# Patient Record
Sex: Male | Born: 1950
Health system: Southern US, Community
[De-identification: ages and names within clinical notes are randomized; demographics above are authoritative.]

## PROBLEM LIST (undated history)

## (undated) DIAGNOSIS — E119 Type 2 diabetes mellitus without complications: Secondary | ICD-10-CM

## (undated) DIAGNOSIS — I10 Essential (primary) hypertension: Secondary | ICD-10-CM

## (undated) DIAGNOSIS — Z973 Presence of spectacles and contact lenses: Secondary | ICD-10-CM

## (undated) DIAGNOSIS — N401 Enlarged prostate with lower urinary tract symptoms: Secondary | ICD-10-CM

## (undated) DIAGNOSIS — Z86010 Personal history of colonic polyps: Secondary | ICD-10-CM

## (undated) DIAGNOSIS — H269 Unspecified cataract: Secondary | ICD-10-CM

## (undated) DIAGNOSIS — C61 Malignant neoplasm of prostate: Secondary | ICD-10-CM

## (undated) DIAGNOSIS — I4510 Unspecified right bundle-branch block: Secondary | ICD-10-CM

## (undated) DIAGNOSIS — Z860101 Personal history of adenomatous and serrated colon polyps: Secondary | ICD-10-CM

## (undated) DIAGNOSIS — Z87828 Personal history of other (healed) physical injury and trauma: Secondary | ICD-10-CM

## (undated) HISTORY — DX: Unspecified cataract: H26.9

## (undated) HISTORY — PX: LUMBAR LAMINECTOMY: SHX95

## (undated) HISTORY — PX: LAMINECTOMY: SHX219

## (undated) HISTORY — DX: Essential (primary) hypertension: I10

## (undated) HISTORY — PX: TONSILLECTOMY: SUR1361

## (undated) HISTORY — PX: ROTATOR CUFF REPAIR: SHX139

## (undated) HISTORY — DX: Type 2 diabetes mellitus without complications: E11.9

## (undated) HISTORY — PX: OTHER SURGICAL HISTORY: SHX169

## (undated) HISTORY — PX: NASAL SINUS SURGERY: SHX719

---

## 1998-05-28 ENCOUNTER — Emergency Department (HOSPITAL_COMMUNITY): Admission: EM | Admit: 1998-05-28 | Discharge: 1998-05-28 | Payer: Self-pay | Admitting: Emergency Medicine

## 1998-07-16 ENCOUNTER — Encounter: Payer: Self-pay | Admitting: Emergency Medicine

## 1998-07-16 ENCOUNTER — Emergency Department (HOSPITAL_COMMUNITY): Admission: EM | Admit: 1998-07-16 | Discharge: 1998-07-16 | Payer: Self-pay | Admitting: Emergency Medicine

## 1998-07-16 ENCOUNTER — Encounter: Payer: Self-pay | Admitting: Psychology

## 1998-12-27 ENCOUNTER — Encounter: Admission: RE | Admit: 1998-12-27 | Discharge: 1999-03-27 | Payer: Self-pay | Admitting: Endocrinology

## 1999-04-05 ENCOUNTER — Encounter: Admission: RE | Admit: 1999-04-05 | Discharge: 1999-07-04 | Payer: Self-pay | Admitting: Orthopedic Surgery

## 2003-06-10 ENCOUNTER — Encounter: Payer: Self-pay | Admitting: Internal Medicine

## 2008-08-24 DIAGNOSIS — E119 Type 2 diabetes mellitus without complications: Secondary | ICD-10-CM | POA: Insufficient documentation

## 2008-08-24 DIAGNOSIS — E785 Hyperlipidemia, unspecified: Secondary | ICD-10-CM | POA: Insufficient documentation

## 2008-08-24 DIAGNOSIS — D126 Benign neoplasm of colon, unspecified: Secondary | ICD-10-CM | POA: Insufficient documentation

## 2008-08-24 DIAGNOSIS — I10 Essential (primary) hypertension: Secondary | ICD-10-CM | POA: Insufficient documentation

## 2008-08-25 ENCOUNTER — Ambulatory Visit: Payer: Self-pay | Admitting: Internal Medicine

## 2008-09-01 ENCOUNTER — Ambulatory Visit: Payer: Self-pay | Admitting: Internal Medicine

## 2008-09-01 HISTORY — PX: COLONOSCOPY: SHX174

## 2011-06-23 LAB — GLUCOSE, CAPILLARY
Glucose-Capillary: 104 mg/dL — ABNORMAL HIGH (ref 70–99)
Glucose-Capillary: 79 mg/dL (ref 70–99)

## 2014-09-18 HISTORY — PX: CATARACT EXTRACTION W/ INTRAOCULAR LENS IMPLANT: SHX1309

## 2015-06-05 ENCOUNTER — Ambulatory Visit (INDEPENDENT_AMBULATORY_CARE_PROVIDER_SITE_OTHER): Payer: BLUE CROSS/BLUE SHIELD | Admitting: Physician Assistant

## 2015-06-05 VITALS — BP 130/62 | HR 81 | Temp 98.4°F | Resp 16 | Ht 70.0 in | Wt 178.0 lb

## 2015-06-05 DIAGNOSIS — H6123 Impacted cerumen, bilateral: Secondary | ICD-10-CM

## 2015-06-05 DIAGNOSIS — H9202 Otalgia, left ear: Secondary | ICD-10-CM | POA: Diagnosis not present

## 2015-06-05 NOTE — Progress Notes (Signed)
   Subjective:    Patient ID: Francisco Fleming, male    DOB: 1951-02-27, 64 y.o.   MRN: [id number]  Chief Complaint  Patient presents with  . Ear Fullness    needs both ears cleaned, left feels worse   Medications, allergies, past medical history, surgical history, family history, social history and problem list reviewed and updated.  HPI  64 yom presents with bilateral ear fullness.  Hx cerumen impaction. Has had flushed in past. Past few wks feels fullness. Thinks may have impaction again. Left ear mild pain past week. Denies drainage, fevers, neck pain. Hearing seems muffled.   Review of Systems No fevers, chills, st, cough, congestion.     Objective:   Physical Exam  Constitutional: He is oriented to person, place, and time.  BP 130/62 mmHg  Pulse 81  Temp(Src) 98.4 F (36.9 C) (Oral)  Resp 16  Ht 5\' 10"  (1.778 m)  Wt 178 lb (80.74 kg)  BMI 25.54 kg/m2  SpO2 98%   HENT:  Bilateral cerumen impaction. Flushed by medical assistant. Post flushing TMs both intact and pearly grey. Canals normal. Small amnt dead skin slough present in right canal after flushing.   No mastoid ttp.   Neck: No Brudzinski's sign noted.  Lymphadenopathy:       Head (right side): No submental, no submandibular and no tonsillar adenopathy present.       Head (left side): No submental, no submandibular and no tonsillar adenopathy present.    He has no cervical adenopathy.  Neurological: He is alert and oriented to person, place, and time.      Assessment & Plan:   Cerumen impaction, bilateral  Left ear pain --cerumen flushed, ears normal post flushing  --ear pain resolved post flushing --debrox to prevent recurrence  Julieta Gutting, PA-C Physician Assistant-Certified [facility]  06/05/2015 11:03 AM

## 2015-06-05 NOTE — Patient Instructions (Signed)
We flushed your ears out today. Try to use ear drops like Debrox which you can pick up from the pharmacy to help prevent this buildup for starting.

## 2015-09-22 ENCOUNTER — Encounter: Payer: Self-pay | Admitting: Internal Medicine

## 2016-03-24 ENCOUNTER — Emergency Department (HOSPITAL_COMMUNITY): Payer: Medicare Other

## 2016-03-24 ENCOUNTER — Emergency Department (HOSPITAL_COMMUNITY)
Admission: EM | Admit: 2016-03-24 | Discharge: 2016-03-25 | Disposition: A | Discharge status: Home / Self Care | Payer: Medicare Other | Attending: Emergency Medicine | Admitting: Emergency Medicine

## 2016-03-24 DIAGNOSIS — W2103XA Struck by baseball, initial encounter: Secondary | ICD-10-CM | POA: Insufficient documentation

## 2016-03-24 DIAGNOSIS — Z79899 Other long term (current) drug therapy: Secondary | ICD-10-CM | POA: Insufficient documentation

## 2016-03-24 DIAGNOSIS — Y999 Unspecified external cause status: Secondary | ICD-10-CM | POA: Diagnosis not present

## 2016-03-24 DIAGNOSIS — S0231XA Fracture of orbital floor, right side, initial encounter for closed fracture: Secondary | ICD-10-CM | POA: Diagnosis not present

## 2016-03-24 DIAGNOSIS — Z7982 Long term (current) use of aspirin: Secondary | ICD-10-CM | POA: Insufficient documentation

## 2016-03-24 DIAGNOSIS — S0285XA Fracture of orbit, unspecified, initial encounter for closed fracture: Secondary | ICD-10-CM

## 2016-03-24 DIAGNOSIS — S0990XA Unspecified injury of head, initial encounter: Secondary | ICD-10-CM | POA: Diagnosis present

## 2016-03-24 DIAGNOSIS — I10 Essential (primary) hypertension: Secondary | ICD-10-CM | POA: Insufficient documentation

## 2016-03-24 DIAGNOSIS — Y939 Activity, unspecified: Secondary | ICD-10-CM | POA: Diagnosis not present

## 2016-03-24 DIAGNOSIS — E119 Type 2 diabetes mellitus without complications: Secondary | ICD-10-CM | POA: Diagnosis not present

## 2016-03-24 DIAGNOSIS — H2101 Hyphema, right eye: Secondary | ICD-10-CM

## 2016-03-24 DIAGNOSIS — Z7984 Long term (current) use of oral hypoglycemic drugs: Secondary | ICD-10-CM | POA: Diagnosis not present

## 2016-03-24 DIAGNOSIS — Y92838 Other recreation area as the place of occurrence of the external cause: Secondary | ICD-10-CM | POA: Diagnosis not present

## 2016-03-24 NOTE — ED Provider Notes (Signed)
CSN: [id number]     Arrival date & time 03/24/16  2201 History   First MD Initiated Contact with Patient 03/24/16 2227     Chief Complaint  Patient presents with  . Head Injury     (Consider location/radiation/quality/duration/timing/severity/associated sxs/prior Treatment) HPI Comments: Patient here after being struck in his right eye with a baseball just prior to arrival. No loss of consciousness. No nausea vomiting. Patient has decreased vision out of that right eye states he can only see colors. Was wearing glasses at the time. Does have a laceration to the right eyebrow. Denies any neck discomfort. No prior history of eye injury  Patient is a 65 y.o. male presenting with head injury. The history is provided by the patient.  Head Injury   Past Medical History  Diagnosis Date  . Diabetes mellitus without complication   . Hypertension    No past surgical history on file. No family history on file. Social History  Substance Use Topics  . Smoking status: Never Smoker   . Smokeless tobacco: Not on file  . Alcohol Use: No    Review of Systems  All other systems reviewed and are negative.     Allergies  Penicillins  Home Medications   Prior to Admission medications   Medication Sig Start Date End Date Taking? Authorizing Provider  aspirin 81 MG tablet Take 81 mg by mouth daily.    Historical Provider, MD  fenofibrate 160 MG tablet Take 160 mg by mouth daily.    Historical Provider, MD  insulin detemir (LEVEMIR) 100 UNIT/ML injection Inject 30 Units into the skin daily.    Historical Provider, MD  losartan (COZAAR) 50 MG tablet Take 50 mg by mouth daily.    Historical Provider, MD  metFORMIN (GLUMETZA) 1000 MG (MOD) 24 hr tablet Take 1,000 mg by mouth 2 (two) times daily with a meal.    Historical Provider, MD  pioglitazone (ACTOS) 15 MG tablet Take 22.5 mg by mouth daily.    Historical Provider, MD   BP 147/74 mmHg  Pulse 64  Temp(Src) 98.3 F (36.8 C) (Oral)  Resp  18  SpO2 98% Physical Exam  Constitutional: He is oriented to person, place, and time. He appears well-developed and well-nourished.  Non-toxic appearance. No distress.  HENT:  Head: Normocephalic and atraumatic.    Eyes: EOM and lids are normal. Right eye exhibits chemosis. Right conjunctiva has a hemorrhage. Right eye exhibits normal extraocular motion and no nystagmus. Right pupil is not reactive. Pupils are unequal.  Right pupil is approximately 6 mm and left pupil is 2 mm  No hyphema appreciated but patient's anterior chamber is hazy.  Neck: Normal range of motion. Neck supple. No tracheal deviation present. No thyroid mass present.  Cardiovascular: Normal rate, regular rhythm and normal heart sounds.  Exam reveals no gallop.   No murmur heard. Pulmonary/Chest: Effort normal and breath sounds normal. No stridor. No respiratory distress. He has no decreased breath sounds. He has no wheezes. He has no rhonchi. He has no rales.  Abdominal: Soft. Normal appearance and bowel sounds are normal. He exhibits no distension. There is no tenderness. There is no rebound and no CVA tenderness.  Musculoskeletal: Normal range of motion. He exhibits no edema or tenderness.  Neurological: He is alert and oriented to person, place, and time. He has normal strength. No cranial nerve deficit or sensory deficit. GCS eye subscore is 4. GCS verbal subscore is 5. GCS motor subscore is 6.  Skin: Skin  is warm and dry. No abrasion and no rash noted.  Psychiatric: He has a normal mood and affect. His speech is normal and behavior is normal.  Nursing note and vitals reviewed.   ED Course  Procedures (including critical care time) Labs Review Labs Reviewed - No data to display  Imaging Review No results found. I have personally reviewed and evaluated these images and lab results as part of my medical decision-making.   EKG Interpretation None      MDM   Final diagnoses:  None    LACERATION  REPAIR Performed by: Leota Jacobsen Authorized by: Leota Jacobsen Consent: Verbal consent obtained. Risks and benefits: risks, benefits and alternatives were discussed Consent given by: patient Patient identity confirmed: provided demographic data Prepped and Draped in normal sterile fashion Wound explored  Laceration Location: right eye brow  Laceration Length: 2cm  No Foreign Bodies seen or palpated  Anesthesia: local infiltration  Local anesthetic: lidocaine 2% w/ epinephrine  Anesthetic total: 8 ml  Irrigation method: syringe Amount of cleaning: standard  Skin closure: simple interrupted  Number of sutures: 10  Technique: interrupted  Patient tolerance: Patient tolerated the procedure well with no immediate complications.   Patient's right eye pressure is about 45. Patient's visual acuity was left eye 20/50 and right eye pt unable to read the chart Patient's tetanus status is up-to-date. Have attempted to contact the ophthalmologist on call, Dr. Alanda Slim, however I was unsuccessful despite multiple attempts. I spoke to the administrative coordinator on call and she attempted as well unsuccessfully.   1:49 AM I was eventually able to contact Dr. Alanda Slim . He will come and see the patient immediately  Lacretia Leigh, MD 03/25/16 [phone]

## 2016-03-24 NOTE — ED Notes (Signed)
[place] [facility] [place] [facility] [facility] Pt was in audience when a baseball hit him on the right side of his head above his eyebrow. EMS reports no LOC, no N/V, bleeding controlled. EMS reported the laceration was 1.5 inches. Pt ambulated to room with steady gait.

## 2016-03-24 NOTE — ED Notes (Signed)
Bed: WA17 Expected date:  Expected time:  Means of arrival:  Comments: EMS [phone]

## 2016-03-25 MED ORDER — ACETAZOLAMIDE ER 500 MG PO CP12: 500.0000 mg | ORAL_CAPSULE | Freq: Two times a day (BID) | ORAL | Status: DC

## 2016-03-25 MED ORDER — ACETAZOLAMIDE 250 MG PO TABS
500.0000 mg | ORAL_TABLET | Freq: Once | ORAL | Status: AC
  Administered 2016-03-25: 500 mg via ORAL
  Filled 2016-03-25: qty 2

## 2016-03-25 MED ORDER — HYDROCODONE-ACETAMINOPHEN 5-325 MG PO TABS: 1.0000 | ORAL_TABLET | ORAL | Status: DC | PRN

## 2016-03-25 MED ORDER — BRIMONIDINE TARTRATE 0.15 % OP SOLN
1.0000 [drp] | Freq: Once | OPHTHALMIC | Status: AC
  Administered 2016-03-25: 1 [drp] via OPHTHALMIC
  Filled 2016-03-25: qty 5

## 2016-03-25 MED ORDER — BRIMONIDINE TARTRATE-TIMOLOL 0.2-0.5 % OP SOLN: 1.0000 [drp] | Freq: Two times a day (BID) | OPHTHALMIC | Status: DC

## 2016-03-25 MED ORDER — TIMOLOL MALEATE 0.5 % OP SOLN
1.0000 [drp] | Freq: Once | OPHTHALMIC | Status: AC
  Administered 2016-03-25: 1 [drp] via OPHTHALMIC
  Filled 2016-03-25: qty 5

## 2016-03-25 MED ORDER — LIDOCAINE-EPINEPHRINE 2 %-1:100000 IJ SOLN
INTRAMUSCULAR | Status: AC
  Administered 2016-03-25: 1 mL
  Filled 2016-03-25: qty 1

## 2016-03-25 MED ORDER — CEPHALEXIN 500 MG PO CAPS: 500.0000 mg | ORAL_CAPSULE | Freq: Three times a day (TID) | ORAL | Status: DC

## 2016-03-25 NOTE — Consult Note (Signed)
Chief Complaint  Patient presents with  . Head Injury  :   ED HPI: HPI     Ophthalmology HPI: This is a 65 y.o.  male with a past ocular history listed below that presents with eye pain, blurry vision after being hit in the eye at a baseball game with a baseball.  Since teh injury he has been companing of blurry vision  OD and eyelid swelling    Past Ocular History: High myopia    Last Eye Exam:  4Months ago, Dr. Lyndel Pleasure    Primary Eye Care:  Dr Lyndel Pleasure.    Past Medical History  Diagnosis Date  . Diabetes mellitus without complication   . Hypertension      No past surgical history on file.   Social History   Social History  . Marital Status: Married    Spouse Name: N/A  . Number of Children: N/A  . Years of Education: N/A   Occupational History  . Not on file.   Social History Main Topics  . Smoking status: Never Smoker   . Smokeless tobacco: Not on file  . Alcohol Use: No  . Drug Use: No  . Sexual Activity: Not on file   Other Topics Concern  . Not on file   Social History Narrative  . No narrative on file     Allergies  Allergen Reactions  . Penicillins Hives and Rash    Has patient had a PCN reaction causing immediate rash, facial/tongue/throat swelling, SOB or lightheadedness with hypotension:No Has patient had a PCN reaction causing severe rash involving mucus membranes or skin necrosis:No Has patient had a PCN reaction that required hospitalization:No Has patient had a PCN reaction occurring within the last 10 years:No If all of the above answers are "NO", then may proceed with Cephalosporin use.      No current facility-administered medications on file prior to encounter.   Current Outpatient Prescriptions on File Prior to Encounter  Medication Sig Dispense Refill  . fenofibrate 160 MG tablet Take 160 mg by mouth daily.    . insulin detemir (LEVEMIR) 100 UNIT/ML injection Inject 30 Units into the skin daily.        ROS    Exam:  General: Awake, Alert, Oriented *3  Vision (near): without correction   OD: 20/400 equivalent  OS: 4 point  Confrontational Field:   Full to count fingers, both eyes  Extraocular Motility:  Full ductions and versions, both eyes  [place]:   Unable  External:   Right periorbital ecchymosis  intact.   Hertel       16/11  Pupils  OD:7 mm unreactive without afferent pupillary defect (APD) by reverse  OS: 14mm to 78mm reactive without afferent pupillary defect (APD)   IOP: 43/22  Repeat s/p Diamox/Timolol/Brim: IOP: 29/22   Slit Lamp Exam:  Lids/Lashes  OD: periorbital ecchymosis  OS: Normal lids and lashes, nor lesion or injury  Conjucntiva/Sclera  OD: Trace subconjunctival hemorrhage  OS: White and quiet  Cornea  OD: Clear without abrasion or defect  OS: Clear without abrasion or defect  Anterior Chamber  OD: 4+RBC, <27mm hyphema  OS: Deep and quiet  Iris  OD: Normal iris architecture  OS: Normal Iris Architecture   Lens  OD: Clear, Without significant opacities  OS: Clear, Without significant opacities  Anterior Vitreous  OD: Clear, without cell  OS: Clear without cell   POSTERIOR POLE EXAM (Dialated with phenylephrine and tropicamide.Dilation may last up to 24  hours)  View:   OD: Poor view  OS: 20/20 view without opacities  Vitreous:   OD: No obvious vitritis/vireous hemorrhage.   OS: Clear, no cell  Disc:   OD: No view  OS: flat, sharp margin, with appropriate color  C:D Ratio:   OD: No View   OS: 0.3  Macula  OD: No View   OS: Flat with appropriate light reflex  Vessels  OD: No View   OS: Normal vasculature  Periphery  OD: No View  OS: Flat 360 degrees without tear, hole or detachment  IMPRESSION: CT HEAD: No acute intracranial process; negative CT HEAD.  CT ORBITS: Trace RIGHT retrobulbar hemorrhage, ocular globes appear intact though, recommend correlation with  ophthalmological examination.  Age indeterminate RIGHT orbital floor and medial orbital wall blowout fractures with possible RIGHT inferior rectus muscle entrapment.  Bscan: Normal architecture.  No retinal detachment seen or vitreous hemorrhage appreciated.     Assessment and Plan:   This is 65 y.o.  male with past ocular history of myopia presents right orbital floor fracture, retrobulbar hemorrhage, hyphema and elevated IOP.   Orbital Floor Fracture:  - No evidence of entrapment, enophthalmos, or motility problems - Recommend prophylactic antibiotics for 5 days  - No Nose blowing.    Retorbulbar Hemorrhage - Minimal. Not cause of elevated IOP. No optic nerve compression. Decompressed by floor fracture  Hyphema - Cause of blurry vision and eleavted IOP (see elvated IOP) - No history of sickle cell or bleeding disorders - Activity restricitons: No heavy lifting. No vigorous exercise.   Elevated IOP:  - Secondary to Hyphema. - Start diamox 500mg  extended release BID - Start Timolol Q2 hours OD - Start Brimondine Q2 hours OD    Follow up without patient opthalmology Sunday at 10:30AM.    Julian Reil, M.D.  [facility] [address] [place],  [address] [phone] ([phone]

## 2016-03-25 NOTE — Discharge Instructions (Signed)
Orbital Floor Fracture:  - Keflex 500 mg TID for 5 days  - No Nose blowing.   Hyphema - Activity restricitons: No heavy lifting. No vigorous exercise.   - Start diamox 500mg  extended release BID - Start Timolol Q2 hours OD - Start Brimondine Q2 hours OD Hyphema Hyphema is bleeding in the eye. This may occurin the front of the eye between the clear covering of the eye (cornea) and the colored part of the eye (iris). You may be able to see the blood in the front part of your eye. A hyphema may be large or small. Hyphema may be painful and can affect your vision. Treatment is important to prevent permanent loss of vision. CAUSES  Eye injury, such as a blow to your eye or upper part of your face, is the most common cause of this condition. Eye surgery can also cause this condition. Other less common causes include:  Abnormal blood vessels that form in the iris.  Eye infections.  Blood clotting disorders.  Artificial lenses used after cataract surgery.  Eye cancer. RISK FACTORS This condition is more likely to occur in people who play sports, especially sports that use small balls. You may also be more likely to develop this condition if you:  Have a disease that prevents normal blood clotting, such as hemophilia.  Take certain medicines that thin your blood, such as aspirin.  Have diabetes.  Had recent eye surgery.  Have sickle cell anemia. SYMPTOMS  The most common symptom of this condition is a pool of blood in the front of your eye. The blood may appear red or black. A very small hyphema may not be visible. A large hyphema may fill part or all of the front part of your eye. Symptoms may also include:   Blurred vision or vision loss.  Pain.  Sensitivity to bright light. DIAGNOSIS  This condition is diagnosed with a medical history and physical exam. You may have a blood test to check for a bleeding disorder or sickle cell disease. You may also have an eye exam done by  an eye specialist (ophthalmologist). This may include:   Checking your eye with a type of microscope (slit lamp).  A vision test.  Measuring the pressure in your eye. TREATMENT  Treatment depends on the severity of the condition. Many hyphemas go away on their own. Your health care provider will monitor your hyphema closely until it goes away completely. Treatment may also include:   Restricted activity or bed rest with your head elevated.  Wearing a cover over your eye (eye shield) to protect it from further injury.  Stopping all medicines that can increase bleeding, such as aspirin. Only do this as told by your health care provider.  Eye drops or medicines taken by mouth to control swelling and pressure in your eye. Eye surgery may be needed to remove the hyphema if other treatments do not help. HOME CARE INSTRUCTIONS   Rest in bed as told by your health care provider. Lie on your back and use extra pillows to keep your head raised.  Take over-the-counter and prescription medicines only as told by your health care provider.  Wear your eye shield as told by your health care provider.  Do not bend forward or lower your head until your health care provider approves.  Do not lift anything that is heavier than 10 lb (4.5 kg) until your health care provider approves.  Keep all follow-up visits as told by your health  care provider. This is important. PREVENTION  It is important to always wear eye protection when you are doing any activity that can result in eye injury. SEEK MEDICAL CARE IF:   You develop pain in the affected eye.  Your vision is not improving.  The amount of blood in your eye does not decrease after several days. SEEK IMMEDIATE MEDICAL CARE IF:   Your vision gets worse.  The amount of blood in your eye increases.  You feel nauseous or vomit.   This information is not intended to replace advice given to you by your health care provider. Make sure you discuss  any questions you have with your health care provider.   Document Released: 12/11/2000 Document Revised: 05/26/2015 Document Reviewed: 01/27/2015 Elsevier Interactive Patient Education [employer].

## 2016-03-25 NOTE — ED Notes (Signed)
MD at bedside repairing laceration.

## 2017-11-19 DIAGNOSIS — Z125 Encounter for screening for malignant neoplasm of prostate: Secondary | ICD-10-CM | POA: Diagnosis not present

## 2017-11-19 DIAGNOSIS — I10 Essential (primary) hypertension: Secondary | ICD-10-CM | POA: Diagnosis not present

## 2017-11-19 DIAGNOSIS — E119 Type 2 diabetes mellitus without complications: Secondary | ICD-10-CM | POA: Diagnosis not present

## 2017-11-19 DIAGNOSIS — R82998 Other abnormal findings in urine: Secondary | ICD-10-CM | POA: Diagnosis not present

## 2017-11-19 DIAGNOSIS — E7849 Other hyperlipidemia: Secondary | ICD-10-CM | POA: Diagnosis not present

## 2017-11-26 DIAGNOSIS — Z0000 Encounter for general adult medical examination without abnormal findings: Secondary | ICD-10-CM | POA: Diagnosis not present

## 2017-11-26 DIAGNOSIS — H4030X0 Glaucoma secondary to eye trauma, unspecified eye, stage unspecified: Secondary | ICD-10-CM | POA: Diagnosis not present

## 2017-11-26 DIAGNOSIS — L308 Other specified dermatitis: Secondary | ICD-10-CM | POA: Diagnosis not present

## 2017-11-26 DIAGNOSIS — E7849 Other hyperlipidemia: Secondary | ICD-10-CM | POA: Diagnosis not present

## 2017-11-26 DIAGNOSIS — R69 Illness, unspecified: Secondary | ICD-10-CM | POA: Diagnosis not present

## 2017-11-26 DIAGNOSIS — M1990 Unspecified osteoarthritis, unspecified site: Secondary | ICD-10-CM | POA: Diagnosis not present

## 2017-11-26 DIAGNOSIS — J3089 Other allergic rhinitis: Secondary | ICD-10-CM | POA: Diagnosis not present

## 2017-11-26 DIAGNOSIS — E119 Type 2 diabetes mellitus without complications: Secondary | ICD-10-CM | POA: Diagnosis not present

## 2017-11-26 DIAGNOSIS — I10 Essential (primary) hypertension: Secondary | ICD-10-CM | POA: Diagnosis not present

## 2017-11-26 DIAGNOSIS — Z6825 Body mass index (BMI) 25.0-25.9, adult: Secondary | ICD-10-CM | POA: Diagnosis not present

## 2017-12-24 DIAGNOSIS — R69 Illness, unspecified: Secondary | ICD-10-CM | POA: Diagnosis not present

## 2018-04-01 DIAGNOSIS — I10 Essential (primary) hypertension: Secondary | ICD-10-CM | POA: Diagnosis not present

## 2018-04-01 DIAGNOSIS — E119 Type 2 diabetes mellitus without complications: Secondary | ICD-10-CM | POA: Diagnosis not present

## 2018-04-01 DIAGNOSIS — Z6825 Body mass index (BMI) 25.0-25.9, adult: Secondary | ICD-10-CM | POA: Diagnosis not present

## 2018-04-01 DIAGNOSIS — E7849 Other hyperlipidemia: Secondary | ICD-10-CM | POA: Diagnosis not present

## 2018-04-01 DIAGNOSIS — J3089 Other allergic rhinitis: Secondary | ICD-10-CM | POA: Diagnosis not present

## 2018-04-02 DIAGNOSIS — E7849 Other hyperlipidemia: Secondary | ICD-10-CM | POA: Diagnosis not present

## 2018-04-11 DIAGNOSIS — Z6825 Body mass index (BMI) 25.0-25.9, adult: Secondary | ICD-10-CM | POA: Diagnosis not present

## 2018-04-11 DIAGNOSIS — H9193 Unspecified hearing loss, bilateral: Secondary | ICD-10-CM | POA: Diagnosis not present

## 2018-04-11 DIAGNOSIS — J209 Acute bronchitis, unspecified: Secondary | ICD-10-CM | POA: Diagnosis not present

## 2018-04-11 DIAGNOSIS — H6123 Impacted cerumen, bilateral: Secondary | ICD-10-CM | POA: Diagnosis not present

## 2018-04-11 DIAGNOSIS — R05 Cough: Secondary | ICD-10-CM | POA: Diagnosis not present

## 2018-04-29 DIAGNOSIS — R69 Illness, unspecified: Secondary | ICD-10-CM | POA: Diagnosis not present

## 2018-06-03 DIAGNOSIS — R69 Illness, unspecified: Secondary | ICD-10-CM | POA: Diagnosis not present

## 2018-08-08 DIAGNOSIS — Z23 Encounter for immunization: Secondary | ICD-10-CM | POA: Diagnosis not present

## 2018-08-08 DIAGNOSIS — E119 Type 2 diabetes mellitus without complications: Secondary | ICD-10-CM | POA: Diagnosis not present

## 2018-08-08 DIAGNOSIS — Z6825 Body mass index (BMI) 25.0-25.9, adult: Secondary | ICD-10-CM | POA: Diagnosis not present

## 2018-08-08 DIAGNOSIS — E7849 Other hyperlipidemia: Secondary | ICD-10-CM | POA: Diagnosis not present

## 2018-08-08 DIAGNOSIS — I10 Essential (primary) hypertension: Secondary | ICD-10-CM | POA: Diagnosis not present

## 2018-08-09 DIAGNOSIS — H5212 Myopia, left eye: Secondary | ICD-10-CM | POA: Diagnosis not present

## 2018-08-09 DIAGNOSIS — E119 Type 2 diabetes mellitus without complications: Secondary | ICD-10-CM | POA: Diagnosis not present

## 2018-08-09 DIAGNOSIS — H52223 Regular astigmatism, bilateral: Secondary | ICD-10-CM | POA: Diagnosis not present

## 2018-08-09 DIAGNOSIS — H5201 Hypermetropia, right eye: Secondary | ICD-10-CM | POA: Diagnosis not present

## 2018-08-09 DIAGNOSIS — H40013 Open angle with borderline findings, low risk, bilateral: Secondary | ICD-10-CM | POA: Diagnosis not present

## 2018-08-09 DIAGNOSIS — Z961 Presence of intraocular lens: Secondary | ICD-10-CM | POA: Diagnosis not present

## 2018-08-09 DIAGNOSIS — H21553 Recession of chamber angle, bilateral: Secondary | ICD-10-CM | POA: Diagnosis not present

## 2018-08-09 DIAGNOSIS — H2512 Age-related nuclear cataract, left eye: Secondary | ICD-10-CM | POA: Diagnosis not present

## 2018-08-19 DIAGNOSIS — R69 Illness, unspecified: Secondary | ICD-10-CM | POA: Diagnosis not present

## 2018-09-18 HISTORY — PX: SHOULDER ARTHROSCOPY WITH ROTATOR CUFF REPAIR AND OPEN BICEPS TENODESIS: SHX6677

## 2018-11-06 ENCOUNTER — Encounter: Payer: Self-pay | Admitting: Internal Medicine

## 2018-11-21 ENCOUNTER — Encounter: Payer: Self-pay | Admitting: Internal Medicine

## 2018-11-25 DIAGNOSIS — R82998 Other abnormal findings in urine: Secondary | ICD-10-CM | POA: Diagnosis not present

## 2018-11-25 DIAGNOSIS — Z125 Encounter for screening for malignant neoplasm of prostate: Secondary | ICD-10-CM | POA: Diagnosis not present

## 2018-11-25 DIAGNOSIS — E119 Type 2 diabetes mellitus without complications: Secondary | ICD-10-CM | POA: Diagnosis not present

## 2018-11-25 DIAGNOSIS — E7849 Other hyperlipidemia: Secondary | ICD-10-CM | POA: Diagnosis not present

## 2018-11-25 DIAGNOSIS — I10 Essential (primary) hypertension: Secondary | ICD-10-CM | POA: Diagnosis not present

## 2018-12-02 DIAGNOSIS — Z0000 Encounter for general adult medical examination without abnormal findings: Secondary | ICD-10-CM | POA: Diagnosis not present

## 2018-12-02 DIAGNOSIS — I10 Essential (primary) hypertension: Secondary | ICD-10-CM | POA: Diagnosis not present

## 2018-12-02 DIAGNOSIS — R69 Illness, unspecified: Secondary | ICD-10-CM | POA: Diagnosis not present

## 2018-12-02 DIAGNOSIS — H4030X0 Glaucoma secondary to eye trauma, unspecified eye, stage unspecified: Secondary | ICD-10-CM | POA: Diagnosis not present

## 2018-12-02 DIAGNOSIS — E1169 Type 2 diabetes mellitus with other specified complication: Secondary | ICD-10-CM | POA: Diagnosis not present

## 2018-12-02 DIAGNOSIS — J3089 Other allergic rhinitis: Secondary | ICD-10-CM | POA: Diagnosis not present

## 2018-12-02 DIAGNOSIS — M1990 Unspecified osteoarthritis, unspecified site: Secondary | ICD-10-CM | POA: Diagnosis not present

## 2018-12-02 DIAGNOSIS — Z23 Encounter for immunization: Secondary | ICD-10-CM | POA: Diagnosis not present

## 2018-12-02 DIAGNOSIS — E7849 Other hyperlipidemia: Secondary | ICD-10-CM | POA: Diagnosis not present

## 2018-12-02 DIAGNOSIS — Z6824 Body mass index (BMI) 24.0-24.9, adult: Secondary | ICD-10-CM | POA: Diagnosis not present

## 2018-12-31 ENCOUNTER — Encounter: Payer: Medicare Other | Admitting: Internal Medicine

## 2019-01-13 DIAGNOSIS — M25511 Pain in right shoulder: Secondary | ICD-10-CM | POA: Diagnosis not present

## 2019-01-13 DIAGNOSIS — I10 Essential (primary) hypertension: Secondary | ICD-10-CM | POA: Diagnosis not present

## 2019-01-13 DIAGNOSIS — M25512 Pain in left shoulder: Secondary | ICD-10-CM | POA: Diagnosis not present

## 2019-01-13 DIAGNOSIS — M129 Arthropathy, unspecified: Secondary | ICD-10-CM | POA: Diagnosis not present

## 2019-01-13 DIAGNOSIS — E119 Type 2 diabetes mellitus without complications: Secondary | ICD-10-CM | POA: Diagnosis not present

## 2019-02-06 DIAGNOSIS — H401131 Primary open-angle glaucoma, bilateral, mild stage: Secondary | ICD-10-CM | POA: Diagnosis not present

## 2019-04-21 DIAGNOSIS — R69 Illness, unspecified: Secondary | ICD-10-CM | POA: Diagnosis not present

## 2019-04-29 DIAGNOSIS — R69 Illness, unspecified: Secondary | ICD-10-CM | POA: Diagnosis not present

## 2019-04-30 DIAGNOSIS — R69 Illness, unspecified: Secondary | ICD-10-CM | POA: Diagnosis not present

## 2019-04-30 DIAGNOSIS — I10 Essential (primary) hypertension: Secondary | ICD-10-CM | POA: Diagnosis not present

## 2019-04-30 DIAGNOSIS — M25512 Pain in left shoulder: Secondary | ICD-10-CM | POA: Diagnosis not present

## 2019-04-30 DIAGNOSIS — E785 Hyperlipidemia, unspecified: Secondary | ICD-10-CM | POA: Diagnosis not present

## 2019-04-30 DIAGNOSIS — E1169 Type 2 diabetes mellitus with other specified complication: Secondary | ICD-10-CM | POA: Diagnosis not present

## 2019-04-30 DIAGNOSIS — M129 Arthropathy, unspecified: Secondary | ICD-10-CM | POA: Diagnosis not present

## 2019-05-05 DIAGNOSIS — M25512 Pain in left shoulder: Secondary | ICD-10-CM | POA: Diagnosis not present

## 2019-05-05 DIAGNOSIS — E119 Type 2 diabetes mellitus without complications: Secondary | ICD-10-CM | POA: Diagnosis not present

## 2019-05-10 DIAGNOSIS — R69 Illness, unspecified: Secondary | ICD-10-CM | POA: Diagnosis not present

## 2019-07-01 ENCOUNTER — Encounter: Payer: Self-pay | Admitting: Internal Medicine

## 2019-07-07 DIAGNOSIS — M25512 Pain in left shoulder: Secondary | ICD-10-CM | POA: Diagnosis not present

## 2019-07-08 DIAGNOSIS — H401131 Primary open-angle glaucoma, bilateral, mild stage: Secondary | ICD-10-CM | POA: Diagnosis not present

## 2019-07-10 DIAGNOSIS — M25512 Pain in left shoulder: Secondary | ICD-10-CM | POA: Diagnosis not present

## 2019-07-10 DIAGNOSIS — M7542 Impingement syndrome of left shoulder: Secondary | ICD-10-CM | POA: Diagnosis not present

## 2019-07-20 HISTORY — PX: COLONOSCOPY: SHX174

## 2019-07-24 ENCOUNTER — Ambulatory Visit (AMBULATORY_SURGERY_CENTER): Payer: Medicare HMO | Admitting: *Deleted

## 2019-07-24 ENCOUNTER — Other Ambulatory Visit: Payer: Self-pay

## 2019-07-24 ENCOUNTER — Encounter: Payer: Self-pay | Admitting: Internal Medicine

## 2019-07-24 VITALS — Temp 97.1°F | Ht 70.0 in | Wt 176.0 lb

## 2019-07-24 DIAGNOSIS — Z1159 Encounter for screening for other viral diseases: Secondary | ICD-10-CM

## 2019-07-24 DIAGNOSIS — Z1211 Encounter for screening for malignant neoplasm of colon: Secondary | ICD-10-CM

## 2019-07-24 MED ORDER — SUPREP BOWEL PREP KIT 17.5-3.13-1.6 GM/177ML PO SOLN: 1.0000 | Freq: Once | ORAL | 0 refills | Status: AC

## 2019-07-24 NOTE — Progress Notes (Signed)
No egg or soy allergy known to patient  No issues with past sedation with any surgeries  or procedures, no intubation problems  No diet pills per patient No home 02 use per patient  No blood thinners per patient  Pt denies issues with constipation  No A fib or A flutter  EMMI video sent to pt's e mail   covid test 11-16 Monday at 8 am   Due to the COVID-19 pandemic we are asking patients to follow these guidelines. Please only bring one care partner. Please be aware that your care partner may wait in the car in the parking lot or if they feel like they will be too hot to wait in the car, they may wait in the lobby on the 4th floor. All care partners are required to wear a mask the entire time (we do not have any that we can provide them), they need to practice social distancing, and we will do a Covid check for all patient's and care partners when you arrive. Also we will check their temperature and your temperature. If the care partner waits in their car they need to stay in the parking lot the entire time and we will call them on their cell phone when the patient is ready for discharge so they can bring the car to the front of the building. Also all patient's will need to wear a mask into building.

## 2019-07-28 ENCOUNTER — Telehealth: Payer: Self-pay | Admitting: Internal Medicine

## 2019-07-28 NOTE — Telephone Encounter (Signed)
Spoke with patient and told him I would leave a sample up front for him to pick up.  Patient agreed.

## 2019-08-04 ENCOUNTER — Ambulatory Visit (INDEPENDENT_AMBULATORY_CARE_PROVIDER_SITE_OTHER): Payer: Medicare HMO

## 2019-08-04 ENCOUNTER — Other Ambulatory Visit: Payer: Self-pay | Admitting: Internal Medicine

## 2019-08-04 DIAGNOSIS — Z1159 Encounter for screening for other viral diseases: Secondary | ICD-10-CM | POA: Diagnosis not present

## 2019-08-04 LAB — SARS CORONAVIRUS 2 (TAT 6-24 HRS): SARS Coronavirus 2: NEGATIVE

## 2019-08-07 ENCOUNTER — Ambulatory Visit (AMBULATORY_SURGERY_CENTER): Payer: Medicare HMO | Admitting: Internal Medicine

## 2019-08-07 ENCOUNTER — Encounter: Payer: Self-pay | Admitting: Internal Medicine

## 2019-08-07 ENCOUNTER — Other Ambulatory Visit: Payer: Self-pay

## 2019-08-07 VITALS — BP 119/60 | HR 60 | Temp 98.0°F | Resp 19 | Ht 70.0 in | Wt 176.0 lb

## 2019-08-07 DIAGNOSIS — I10 Essential (primary) hypertension: Secondary | ICD-10-CM | POA: Diagnosis not present

## 2019-08-07 DIAGNOSIS — Z1211 Encounter for screening for malignant neoplasm of colon: Secondary | ICD-10-CM | POA: Diagnosis not present

## 2019-08-07 DIAGNOSIS — D123 Benign neoplasm of transverse colon: Secondary | ICD-10-CM | POA: Diagnosis not present

## 2019-08-07 DIAGNOSIS — D125 Benign neoplasm of sigmoid colon: Secondary | ICD-10-CM

## 2019-08-07 DIAGNOSIS — E119 Type 2 diabetes mellitus without complications: Secondary | ICD-10-CM | POA: Diagnosis not present

## 2019-08-07 MED ORDER — SODIUM CHLORIDE 0.9 % IV SOLN: 500.0000 mL | INTRAVENOUS | Status: DC

## 2019-08-07 NOTE — Op Note (Signed)
[facility] Patient Name: Francisco Fleming Procedure Date: 08/07/2019 7:59 AM MRN: [id number] Endoscopist: Docia Chuck. Henrene Pastor , MD Age: 68 Referring MD:  Date of Birth: 1951-01-20 Gender: Male Account #: [id number] Procedure:                Colonoscopy with cold snare polypectomy x 2 Indications:              Screening for colorectal malignant neoplasm.                            Previous examinations 2004 and 2009 both negative                            for neoplasia Medicines:                Monitored Anesthesia Care Procedure:                Pre-Anesthesia Assessment:                           - Prior to the procedure, a History and Physical                            was performed, and patient medications and                            allergies were reviewed. The patient's tolerance of                            previous anesthesia was also reviewed. The risks                            and benefits of the procedure and the sedation                            options and risks were discussed with the patient.                            All questions were answered, and informed consent                            was obtained. Prior Anticoagulants: The patient has                            taken no previous anticoagulant or antiplatelet                            agents. ASA Grade Assessment: II - A patient with                            mild systemic disease. After reviewing the risks                            and benefits, the patient was deemed in  satisfactory condition to undergo the procedure.                           After obtaining informed consent, the colonoscope                            was passed under direct vision. Throughout the                            procedure, the patient's blood pressure, pulse, and                            oxygen saturations were monitored continuously. The                            Colonoscope was  introduced through the anus and                            advanced to the the cecum, identified by                            appendiceal orifice and ileocecal valve. The                            ileocecal valve, appendiceal orifice, and rectum                            were photographed. The quality of the bowel                            preparation was excellent. The colonoscopy was                            performed without difficulty. The patient tolerated                            the procedure well. The bowel preparation used was                            SUPREP via split dose instruction. Scope In: 8:06:23 AM Scope Out: 8:24:16 AM Scope Withdrawal Time: 0 hours 14 minutes 16 seconds  Total Procedure Duration: 0 hours 17 minutes 53 seconds  Findings:                 Two polyps were found in the sigmoid colon and                            transverse colon. The polyps were 4 to 5 mm in                            size. These polyps were removed with a cold snare.                            Resection and retrieval were complete.  The exam was otherwise without abnormality on                            direct and retroflexion views. Complications:            No immediate complications. Estimated blood loss:                            None. Estimated Blood Loss:     Estimated blood loss: none. Impression:               - Two 4 to 5 mm polyps in the sigmoid colon and in                            the transverse colon, removed with a cold snare.                            Resected and retrieved.                           - The examination was otherwise normal on direct                            and retroflexion views. Recommendation:           - Repeat colonoscopy in 7 years for surveillance.                           - Patient has a contact number available for                            emergencies. The signs and symptoms of potential                             delayed complications were discussed with the                            patient. Return to normal activities tomorrow.                            Written discharge instructions were provided to the                            patient.                           - Resume previous diet.                           - Continue present medications.                           - Await pathology results. Docia Chuck. Henrene Pastor, MD 08/07/2019 8:36:32 AM This report has been signed electronically.

## 2019-08-07 NOTE — Progress Notes (Signed)
Called to room to assist during endoscopic procedure.  Patient ID and intended procedure confirmed with present staff. Received instructions for my participation in the procedure from the performing physician.  

## 2019-08-07 NOTE — Progress Notes (Signed)
Report to PACU, RN, vss, BBS= Clear.  

## 2019-08-07 NOTE — Patient Instructions (Signed)
Impression/Recommendations:  [place] handout given to patient.  Repeat colonoscopy in 7 years for surveillance.  Resume previous diet. Continue present medications.  Await pathology results.  YOU HAD AN ENDOSCOPIC PROCEDURE TODAY AT [facility]:   Refer to the procedure report that was given to you for any specific questions about what was found during the examination.  If the procedure report does not answer your questions, please call your gastroenterologist to clarify.  If you requested that your care partner not be given the details of your procedure findings, then the procedure report has been included in a sealed envelope for you to review at your convenience later.  YOU SHOULD EXPECT: Some feelings of bloating in the abdomen. Passage of more gas than usual.  Walking can help get rid of the air that was put into your GI tract during the procedure and reduce the bloating. If you had a lower endoscopy (such as a colonoscopy or flexible sigmoidoscopy) you may notice spotting of blood in your stool or on the toilet paper. If you underwent a bowel prep for your procedure, you may not have a normal bowel movement for a few days.  Please Note:  You might notice some irritation and congestion in your nose or some drainage.  This is from the oxygen used during your procedure.  There is no need for concern and it should clear up in a day or so.  SYMPTOMS TO REPORT IMMEDIATELY:   Following lower endoscopy (colonoscopy or flexible sigmoidoscopy):  Excessive amounts of blood in the stool  Significant tenderness or worsening of abdominal pains  Swelling of the abdomen that is new, acute  Fever of 100F or higher For urgent or emergent issues, a gastroenterologist can be reached at any hour by calling [phone].   DIET:  We do recommend a small meal at first, but then you may proceed to your regular diet.  Drink plenty of fluids but you should avoid alcoholic beverages for 24  hours.  ACTIVITY:  You should plan to take it easy for the rest of today and you should NOT DRIVE or use heavy machinery until tomorrow (because of the sedation medicines used during the test).    FOLLOW UP: Our staff will call the number listed on your records 48-72 hours following your procedure to check on you and address any questions or concerns that you may have regarding the information given to you following your procedure. If we do not reach you, we will leave a message.  We will attempt to reach you two times.  During this call, we will ask if you have developed any symptoms of COVID 19. If you develop any symptoms (ie: fever, flu-like symptoms, shortness of breath, cough etc.) before then, please call [phone].  If you test positive for Covid 19 in the 2 weeks post procedure, please call and report this information to [place].    If any biopsies were taken you will be contacted by phone or by letter within the next 1-3 weeks.  Please call us at [phone] if you have not heard about the biopsies in 3 weeks.    SIGNATURES/CONFIDENTIALITY: You and/or your care partner have signed paperwork which will be entered into your electronic medical record.  These signatures attest to the fact that that the information above on your After Visit Summary has been reviewed and is understood.  Full responsibility of the confidentiality of this discharge information lies with you and/or your care-partner.

## 2019-08-07 NOTE — Progress Notes (Signed)
Temp JB V/s [place]  I have reviewed the patient's medical history in detail and updated the computerized patient record. 

## 2019-08-11 ENCOUNTER — Telehealth: Payer: Self-pay | Admitting: *Deleted

## 2019-08-11 DIAGNOSIS — M25512 Pain in left shoulder: Secondary | ICD-10-CM | POA: Diagnosis not present

## 2019-08-11 NOTE — Telephone Encounter (Signed)
  Follow up Call-  Call back number 08/07/2019  Post procedure Call Back phone  # [phone]  Permission to leave phone message Yes  Some recent data might be hidden     Patient questions:  Do you have a fever, pain , or abdominal swelling? No. Pain Score  0 *  Have you tolerated food without any problems? Yes.    Have you been able to return to your normal activities? Yes.    Do you have any questions about your discharge instructions: Diet   No. Medications  No. Follow up visit  No.  Do you have questions or concerns about your Care? No.  Actions: * If pain score is 4 or above: No action needed, pain <4. 1. Have you developed a fever since your procedure? no  2.   Have you had an respiratory symptoms (SOB or cough) since your procedure? no  3.   Have you tested positive for COVID 19 since your procedure no  4.   Have you had any family members/close contacts diagnosed with the COVID 19 since your procedure?  no   If yes to any of these questions please route to Joylene John, RN and Alphonsa Gin, Therapist, sports.

## 2019-08-11 NOTE — Telephone Encounter (Signed)
  Follow up Call-  Call back number 08/07/2019  Post procedure Call Back phone  # [phone]  Permission to leave phone message Yes  Some recent data might be hidden     Patient questions:  Message left to call us if necessary.

## 2019-08-12 ENCOUNTER — Encounter: Payer: Self-pay | Admitting: Internal Medicine

## 2019-08-19 DIAGNOSIS — M25512 Pain in left shoulder: Secondary | ICD-10-CM | POA: Diagnosis not present

## 2019-08-22 DIAGNOSIS — M25512 Pain in left shoulder: Secondary | ICD-10-CM | POA: Diagnosis not present

## 2019-09-05 DIAGNOSIS — E119 Type 2 diabetes mellitus without complications: Secondary | ICD-10-CM | POA: Diagnosis not present

## 2019-09-05 DIAGNOSIS — E785 Hyperlipidemia, unspecified: Secondary | ICD-10-CM | POA: Diagnosis not present

## 2019-09-05 DIAGNOSIS — Z01818 Encounter for other preprocedural examination: Secondary | ICD-10-CM | POA: Diagnosis not present

## 2019-09-05 DIAGNOSIS — M25512 Pain in left shoulder: Secondary | ICD-10-CM | POA: Diagnosis not present

## 2019-09-05 DIAGNOSIS — I10 Essential (primary) hypertension: Secondary | ICD-10-CM | POA: Diagnosis not present

## 2019-09-05 DIAGNOSIS — M1990 Unspecified osteoarthritis, unspecified site: Secondary | ICD-10-CM | POA: Diagnosis not present

## 2019-09-19 DIAGNOSIS — Z87442 Personal history of urinary calculi: Secondary | ICD-10-CM

## 2019-09-19 HISTORY — DX: Personal history of urinary calculi: Z87.442

## 2019-09-23 DIAGNOSIS — D126 Benign neoplasm of colon, unspecified: Secondary | ICD-10-CM | POA: Diagnosis not present

## 2019-09-23 DIAGNOSIS — H4030X0 Glaucoma secondary to eye trauma, unspecified eye, stage unspecified: Secondary | ICD-10-CM | POA: Diagnosis not present

## 2019-09-23 DIAGNOSIS — E785 Hyperlipidemia, unspecified: Secondary | ICD-10-CM | POA: Diagnosis not present

## 2019-09-23 DIAGNOSIS — E1169 Type 2 diabetes mellitus with other specified complication: Secondary | ICD-10-CM | POA: Diagnosis not present

## 2019-09-23 DIAGNOSIS — I10 Essential (primary) hypertension: Secondary | ICD-10-CM | POA: Diagnosis not present

## 2019-09-23 DIAGNOSIS — M25512 Pain in left shoulder: Secondary | ICD-10-CM | POA: Diagnosis not present

## 2019-09-23 DIAGNOSIS — R69 Illness, unspecified: Secondary | ICD-10-CM | POA: Diagnosis not present

## 2019-09-25 DIAGNOSIS — M19012 Primary osteoarthritis, left shoulder: Secondary | ICD-10-CM | POA: Diagnosis not present

## 2019-09-25 DIAGNOSIS — M24112 Other articular cartilage disorders, left shoulder: Secondary | ICD-10-CM | POA: Diagnosis not present

## 2019-09-25 DIAGNOSIS — G8918 Other acute postprocedural pain: Secondary | ICD-10-CM | POA: Diagnosis not present

## 2019-09-25 DIAGNOSIS — S46812A Strain of other muscles, fascia and tendons at shoulder and upper arm level, left arm, initial encounter: Secondary | ICD-10-CM | POA: Diagnosis not present

## 2019-09-25 DIAGNOSIS — S46012A Strain of muscle(s) and tendon(s) of the rotator cuff of left shoulder, initial encounter: Secondary | ICD-10-CM | POA: Diagnosis not present

## 2019-09-25 DIAGNOSIS — M7542 Impingement syndrome of left shoulder: Secondary | ICD-10-CM | POA: Diagnosis not present

## 2019-09-25 DIAGNOSIS — Y999 Unspecified external cause status: Secondary | ICD-10-CM | POA: Diagnosis not present

## 2019-09-25 DIAGNOSIS — M7522 Bicipital tendinitis, left shoulder: Secondary | ICD-10-CM | POA: Diagnosis not present

## 2019-10-07 DIAGNOSIS — E119 Type 2 diabetes mellitus without complications: Secondary | ICD-10-CM | POA: Diagnosis not present

## 2019-10-08 DIAGNOSIS — M6281 Muscle weakness (generalized): Secondary | ICD-10-CM | POA: Diagnosis not present

## 2019-10-08 DIAGNOSIS — M7542 Impingement syndrome of left shoulder: Secondary | ICD-10-CM | POA: Diagnosis not present

## 2019-10-08 DIAGNOSIS — M7522 Bicipital tendinitis, left shoulder: Secondary | ICD-10-CM | POA: Diagnosis not present

## 2019-10-08 DIAGNOSIS — S46012D Strain of muscle(s) and tendon(s) of the rotator cuff of left shoulder, subsequent encounter: Secondary | ICD-10-CM | POA: Diagnosis not present

## 2019-10-13 DIAGNOSIS — M7542 Impingement syndrome of left shoulder: Secondary | ICD-10-CM | POA: Diagnosis not present

## 2019-10-13 DIAGNOSIS — M7522 Bicipital tendinitis, left shoulder: Secondary | ICD-10-CM | POA: Diagnosis not present

## 2019-10-13 DIAGNOSIS — M6281 Muscle weakness (generalized): Secondary | ICD-10-CM | POA: Diagnosis not present

## 2019-10-13 DIAGNOSIS — S46012D Strain of muscle(s) and tendon(s) of the rotator cuff of left shoulder, subsequent encounter: Secondary | ICD-10-CM | POA: Diagnosis not present

## 2019-10-16 DIAGNOSIS — M7542 Impingement syndrome of left shoulder: Secondary | ICD-10-CM | POA: Diagnosis not present

## 2019-10-16 DIAGNOSIS — S46012D Strain of muscle(s) and tendon(s) of the rotator cuff of left shoulder, subsequent encounter: Secondary | ICD-10-CM | POA: Diagnosis not present

## 2019-10-16 DIAGNOSIS — M7522 Bicipital tendinitis, left shoulder: Secondary | ICD-10-CM | POA: Diagnosis not present

## 2019-10-16 DIAGNOSIS — M6281 Muscle weakness (generalized): Secondary | ICD-10-CM | POA: Diagnosis not present

## 2019-10-21 DIAGNOSIS — M7522 Bicipital tendinitis, left shoulder: Secondary | ICD-10-CM | POA: Diagnosis not present

## 2019-10-21 DIAGNOSIS — M7542 Impingement syndrome of left shoulder: Secondary | ICD-10-CM | POA: Diagnosis not present

## 2019-10-21 DIAGNOSIS — S46012D Strain of muscle(s) and tendon(s) of the rotator cuff of left shoulder, subsequent encounter: Secondary | ICD-10-CM | POA: Diagnosis not present

## 2019-10-21 DIAGNOSIS — M6281 Muscle weakness (generalized): Secondary | ICD-10-CM | POA: Diagnosis not present

## 2019-10-22 DIAGNOSIS — R69 Illness, unspecified: Secondary | ICD-10-CM | POA: Diagnosis not present

## 2019-10-23 DIAGNOSIS — M6281 Muscle weakness (generalized): Secondary | ICD-10-CM | POA: Diagnosis not present

## 2019-10-23 DIAGNOSIS — S46012D Strain of muscle(s) and tendon(s) of the rotator cuff of left shoulder, subsequent encounter: Secondary | ICD-10-CM | POA: Diagnosis not present

## 2019-10-23 DIAGNOSIS — M7522 Bicipital tendinitis, left shoulder: Secondary | ICD-10-CM | POA: Diagnosis not present

## 2019-10-28 DIAGNOSIS — R69 Illness, unspecified: Secondary | ICD-10-CM | POA: Diagnosis not present

## 2019-10-29 DIAGNOSIS — M6281 Muscle weakness (generalized): Secondary | ICD-10-CM | POA: Diagnosis not present

## 2019-10-29 DIAGNOSIS — M7542 Impingement syndrome of left shoulder: Secondary | ICD-10-CM | POA: Diagnosis not present

## 2019-10-29 DIAGNOSIS — S46012D Strain of muscle(s) and tendon(s) of the rotator cuff of left shoulder, subsequent encounter: Secondary | ICD-10-CM | POA: Diagnosis not present

## 2019-10-29 DIAGNOSIS — M7522 Bicipital tendinitis, left shoulder: Secondary | ICD-10-CM | POA: Diagnosis not present

## 2019-11-05 DIAGNOSIS — M7522 Bicipital tendinitis, left shoulder: Secondary | ICD-10-CM | POA: Diagnosis not present

## 2019-11-05 DIAGNOSIS — M7542 Impingement syndrome of left shoulder: Secondary | ICD-10-CM | POA: Diagnosis not present

## 2019-11-05 DIAGNOSIS — M6281 Muscle weakness (generalized): Secondary | ICD-10-CM | POA: Diagnosis not present

## 2019-11-05 DIAGNOSIS — S46012D Strain of muscle(s) and tendon(s) of the rotator cuff of left shoulder, subsequent encounter: Secondary | ICD-10-CM | POA: Diagnosis not present

## 2019-11-10 DIAGNOSIS — R69 Illness, unspecified: Secondary | ICD-10-CM | POA: Diagnosis not present

## 2019-11-11 ENCOUNTER — Ambulatory Visit: Payer: Medicare HMO

## 2019-11-12 DIAGNOSIS — M7542 Impingement syndrome of left shoulder: Secondary | ICD-10-CM | POA: Diagnosis not present

## 2019-11-12 DIAGNOSIS — M7522 Bicipital tendinitis, left shoulder: Secondary | ICD-10-CM | POA: Diagnosis not present

## 2019-11-12 DIAGNOSIS — M6281 Muscle weakness (generalized): Secondary | ICD-10-CM | POA: Diagnosis not present

## 2019-11-12 DIAGNOSIS — S46012D Strain of muscle(s) and tendon(s) of the rotator cuff of left shoulder, subsequent encounter: Secondary | ICD-10-CM | POA: Diagnosis not present

## 2019-11-19 DIAGNOSIS — M7522 Bicipital tendinitis, left shoulder: Secondary | ICD-10-CM | POA: Diagnosis not present

## 2019-11-19 DIAGNOSIS — S46012D Strain of muscle(s) and tendon(s) of the rotator cuff of left shoulder, subsequent encounter: Secondary | ICD-10-CM | POA: Diagnosis not present

## 2019-11-19 DIAGNOSIS — M6281 Muscle weakness (generalized): Secondary | ICD-10-CM | POA: Diagnosis not present

## 2019-11-19 DIAGNOSIS — M7542 Impingement syndrome of left shoulder: Secondary | ICD-10-CM | POA: Diagnosis not present

## 2019-11-26 DIAGNOSIS — M7542 Impingement syndrome of left shoulder: Secondary | ICD-10-CM | POA: Diagnosis not present

## 2019-11-26 DIAGNOSIS — M7522 Bicipital tendinitis, left shoulder: Secondary | ICD-10-CM | POA: Diagnosis not present

## 2019-11-26 DIAGNOSIS — M6281 Muscle weakness (generalized): Secondary | ICD-10-CM | POA: Diagnosis not present

## 2019-11-26 DIAGNOSIS — S46012D Strain of muscle(s) and tendon(s) of the rotator cuff of left shoulder, subsequent encounter: Secondary | ICD-10-CM | POA: Diagnosis not present

## 2019-12-03 DIAGNOSIS — M7522 Bicipital tendinitis, left shoulder: Secondary | ICD-10-CM | POA: Diagnosis not present

## 2019-12-03 DIAGNOSIS — M7542 Impingement syndrome of left shoulder: Secondary | ICD-10-CM | POA: Diagnosis not present

## 2019-12-03 DIAGNOSIS — S46012D Strain of muscle(s) and tendon(s) of the rotator cuff of left shoulder, subsequent encounter: Secondary | ICD-10-CM | POA: Diagnosis not present

## 2019-12-03 DIAGNOSIS — M6281 Muscle weakness (generalized): Secondary | ICD-10-CM | POA: Diagnosis not present

## 2019-12-09 DIAGNOSIS — Z125 Encounter for screening for malignant neoplasm of prostate: Secondary | ICD-10-CM | POA: Diagnosis not present

## 2019-12-09 DIAGNOSIS — E7849 Other hyperlipidemia: Secondary | ICD-10-CM | POA: Diagnosis not present

## 2019-12-09 DIAGNOSIS — E1169 Type 2 diabetes mellitus with other specified complication: Secondary | ICD-10-CM | POA: Diagnosis not present

## 2019-12-09 DIAGNOSIS — Z0000 Encounter for general adult medical examination without abnormal findings: Secondary | ICD-10-CM | POA: Diagnosis not present

## 2019-12-12 DIAGNOSIS — M6281 Muscle weakness (generalized): Secondary | ICD-10-CM | POA: Diagnosis not present

## 2019-12-16 DIAGNOSIS — R69 Illness, unspecified: Secondary | ICD-10-CM | POA: Diagnosis not present

## 2019-12-16 DIAGNOSIS — I10 Essential (primary) hypertension: Secondary | ICD-10-CM | POA: Diagnosis not present

## 2019-12-16 DIAGNOSIS — Z0000 Encounter for general adult medical examination without abnormal findings: Secondary | ICD-10-CM | POA: Diagnosis not present

## 2019-12-16 DIAGNOSIS — R972 Elevated prostate specific antigen [PSA]: Secondary | ICD-10-CM | POA: Diagnosis not present

## 2019-12-16 DIAGNOSIS — E1169 Type 2 diabetes mellitus with other specified complication: Secondary | ICD-10-CM | POA: Diagnosis not present

## 2019-12-16 DIAGNOSIS — M1990 Unspecified osteoarthritis, unspecified site: Secondary | ICD-10-CM | POA: Diagnosis not present

## 2019-12-16 DIAGNOSIS — D126 Benign neoplasm of colon, unspecified: Secondary | ICD-10-CM | POA: Diagnosis not present

## 2019-12-16 DIAGNOSIS — M25512 Pain in left shoulder: Secondary | ICD-10-CM | POA: Diagnosis not present

## 2019-12-16 DIAGNOSIS — E7849 Other hyperlipidemia: Secondary | ICD-10-CM | POA: Diagnosis not present

## 2019-12-16 DIAGNOSIS — R82998 Other abnormal findings in urine: Secondary | ICD-10-CM | POA: Diagnosis not present

## 2019-12-16 DIAGNOSIS — H4030X0 Glaucoma secondary to eye trauma, unspecified eye, stage unspecified: Secondary | ICD-10-CM | POA: Diagnosis not present

## 2019-12-17 DIAGNOSIS — M7542 Impingement syndrome of left shoulder: Secondary | ICD-10-CM | POA: Diagnosis not present

## 2019-12-17 DIAGNOSIS — M6281 Muscle weakness (generalized): Secondary | ICD-10-CM | POA: Diagnosis not present

## 2019-12-17 DIAGNOSIS — M7522 Bicipital tendinitis, left shoulder: Secondary | ICD-10-CM | POA: Diagnosis not present

## 2019-12-17 DIAGNOSIS — S46012D Strain of muscle(s) and tendon(s) of the rotator cuff of left shoulder, subsequent encounter: Secondary | ICD-10-CM | POA: Diagnosis not present

## 2019-12-31 DIAGNOSIS — M7542 Impingement syndrome of left shoulder: Secondary | ICD-10-CM | POA: Diagnosis not present

## 2019-12-31 DIAGNOSIS — M6281 Muscle weakness (generalized): Secondary | ICD-10-CM | POA: Diagnosis not present

## 2019-12-31 DIAGNOSIS — M7522 Bicipital tendinitis, left shoulder: Secondary | ICD-10-CM | POA: Diagnosis not present

## 2019-12-31 DIAGNOSIS — S46012D Strain of muscle(s) and tendon(s) of the rotator cuff of left shoulder, subsequent encounter: Secondary | ICD-10-CM | POA: Diagnosis not present

## 2020-01-13 DIAGNOSIS — M6281 Muscle weakness (generalized): Secondary | ICD-10-CM | POA: Diagnosis not present

## 2020-01-13 DIAGNOSIS — S46012D Strain of muscle(s) and tendon(s) of the rotator cuff of left shoulder, subsequent encounter: Secondary | ICD-10-CM | POA: Diagnosis not present

## 2020-01-13 DIAGNOSIS — M7542 Impingement syndrome of left shoulder: Secondary | ICD-10-CM | POA: Diagnosis not present

## 2020-01-13 DIAGNOSIS — M7522 Bicipital tendinitis, left shoulder: Secondary | ICD-10-CM | POA: Diagnosis not present

## 2020-01-26 DIAGNOSIS — R69 Illness, unspecified: Secondary | ICD-10-CM | POA: Diagnosis not present

## 2020-04-21 DIAGNOSIS — M1990 Unspecified osteoarthritis, unspecified site: Secondary | ICD-10-CM | POA: Diagnosis not present

## 2020-04-21 DIAGNOSIS — E785 Hyperlipidemia, unspecified: Secondary | ICD-10-CM | POA: Diagnosis not present

## 2020-04-21 DIAGNOSIS — I10 Essential (primary) hypertension: Secondary | ICD-10-CM | POA: Diagnosis not present

## 2020-04-21 DIAGNOSIS — R972 Elevated prostate specific antigen [PSA]: Secondary | ICD-10-CM | POA: Diagnosis not present

## 2020-04-21 DIAGNOSIS — E1169 Type 2 diabetes mellitus with other specified complication: Secondary | ICD-10-CM | POA: Diagnosis not present

## 2020-04-26 DIAGNOSIS — R69 Illness, unspecified: Secondary | ICD-10-CM | POA: Diagnosis not present

## 2020-05-21 DIAGNOSIS — Z20828 Contact with and (suspected) exposure to other viral communicable diseases: Secondary | ICD-10-CM | POA: Diagnosis not present

## 2020-05-21 DIAGNOSIS — B349 Viral infection, unspecified: Secondary | ICD-10-CM | POA: Diagnosis not present

## 2020-05-21 DIAGNOSIS — Z1152 Encounter for screening for COVID-19: Secondary | ICD-10-CM | POA: Diagnosis not present

## 2020-05-21 DIAGNOSIS — J029 Acute pharyngitis, unspecified: Secondary | ICD-10-CM | POA: Diagnosis not present

## 2020-05-31 ENCOUNTER — Emergency Department (HOSPITAL_COMMUNITY): Payer: Medicare HMO

## 2020-05-31 ENCOUNTER — Other Ambulatory Visit: Payer: Self-pay

## 2020-05-31 ENCOUNTER — Emergency Department (HOSPITAL_COMMUNITY)
Admission: EM | Admit: 2020-05-31 | Discharge: 2020-05-31 | Disposition: A | Discharge status: Home / Self Care | Payer: Medicare HMO | Attending: Emergency Medicine | Admitting: Emergency Medicine

## 2020-05-31 ENCOUNTER — Encounter (HOSPITAL_COMMUNITY): Payer: Self-pay | Admitting: Emergency Medicine

## 2020-05-31 DIAGNOSIS — Z79899 Other long term (current) drug therapy: Secondary | ICD-10-CM | POA: Insufficient documentation

## 2020-05-31 DIAGNOSIS — N201 Calculus of ureter: Secondary | ICD-10-CM

## 2020-05-31 DIAGNOSIS — Z87891 Personal history of nicotine dependence: Secondary | ICD-10-CM | POA: Diagnosis not present

## 2020-05-31 DIAGNOSIS — N132 Hydronephrosis with renal and ureteral calculous obstruction: Secondary | ICD-10-CM | POA: Insufficient documentation

## 2020-05-31 DIAGNOSIS — E119 Type 2 diabetes mellitus without complications: Secondary | ICD-10-CM | POA: Diagnosis not present

## 2020-05-31 DIAGNOSIS — I10 Essential (primary) hypertension: Secondary | ICD-10-CM | POA: Diagnosis not present

## 2020-05-31 DIAGNOSIS — Z7982 Long term (current) use of aspirin: Secondary | ICD-10-CM | POA: Diagnosis not present

## 2020-05-31 DIAGNOSIS — I708 Atherosclerosis of other arteries: Secondary | ICD-10-CM | POA: Diagnosis not present

## 2020-05-31 DIAGNOSIS — I700 Atherosclerosis of aorta: Secondary | ICD-10-CM | POA: Diagnosis not present

## 2020-05-31 DIAGNOSIS — Z794 Long term (current) use of insulin: Secondary | ICD-10-CM | POA: Diagnosis not present

## 2020-05-31 DIAGNOSIS — N400 Enlarged prostate without lower urinary tract symptoms: Secondary | ICD-10-CM | POA: Diagnosis not present

## 2020-05-31 DIAGNOSIS — R109 Unspecified abdominal pain: Secondary | ICD-10-CM | POA: Diagnosis present

## 2020-05-31 LAB — URINALYSIS, ROUTINE W REFLEX MICROSCOPIC
Bacteria, UA: NONE SEEN
Bilirubin Urine: NEGATIVE
Glucose, UA: NEGATIVE mg/dL
Ketones, ur: NEGATIVE mg/dL
Leukocytes,Ua: NEGATIVE
Nitrite: NEGATIVE
Protein, ur: NEGATIVE mg/dL
Specific Gravity, Urine: 1.02 (ref 1.005–1.030)
pH: 5 (ref 5.0–8.0)

## 2020-05-31 LAB — COMPREHENSIVE METABOLIC PANEL
ALT: 26 U/L (ref 0–44)
AST: 23 U/L (ref 15–41)
Albumin: 4.8 g/dL (ref 3.5–5.0)
Alkaline Phosphatase: 80 U/L (ref 38–126)
Anion gap: 13 (ref 5–15)
BUN: 27 mg/dL — ABNORMAL HIGH (ref 8–23)
CO2: 24 mmol/L (ref 22–32)
Calcium: 10.3 mg/dL (ref 8.9–10.3)
Chloride: 103 mmol/L (ref 98–111)
Creatinine, Ser: 0.99 mg/dL (ref 0.61–1.24)
GFR calc Af Amer: >60 mL/min (ref >60)
GFR calc non Af Amer: >60 mL/min (ref >60)
Glucose, Bld: 129 mg/dL — ABNORMAL HIGH (ref 70–99)
Potassium: 3.8 mmol/L (ref 3.5–5.1)
Sodium: 140 mmol/L (ref 135–145)
Total Bilirubin: 0.6 mg/dL (ref 0.3–1.2)
Total Protein: 7.9 g/dL (ref 6.5–8.1)

## 2020-05-31 LAB — LIPASE, BLOOD: Lipase: 37 U/L (ref 11–51)

## 2020-05-31 LAB — CBC
HCT: 48.1 % (ref 39.0–52.0)
Hemoglobin: 16 g/dL (ref 13.0–17.0)
MCH: 32.8 pg (ref 26.0–34.0)
MCHC: 33.3 g/dL (ref 30.0–36.0)
MCV: 98.6 fL (ref 80.0–100.0)
Platelets: 223 10*3/uL (ref 150–400)
RBC: 4.88 MIL/uL (ref 4.22–5.81)
RDW: 12.7 % (ref 11.5–15.5)
WBC: 18.1 10*3/uL — ABNORMAL HIGH (ref 4.0–10.5)
nRBC: 0 % (ref 0.0–0.2)

## 2020-05-31 LAB — GLUCOSE, CAPILLARY: Glucose-Capillary: 158 mg/dL — ABNORMAL HIGH (ref 70–99)

## 2020-05-31 MED ORDER — HYDROMORPHONE HCL 1 MG/ML IJ SOLN
1.0000 mg | Freq: Once | INTRAMUSCULAR | Status: AC
  Administered 2020-05-31: 1 mg via INTRAVENOUS
  Filled 2020-05-31: qty 1

## 2020-05-31 MED ORDER — TAMSULOSIN HCL 0.4 MG PO CAPS: ORAL_CAPSULE | ORAL | 0 refills | Status: DC

## 2020-05-31 MED ORDER — ONDANSETRON HCL 4 MG/2ML IJ SOLN
4.0000 mg | Freq: Once | INTRAMUSCULAR | Status: AC
  Administered 2020-05-31: 4 mg via INTRAVENOUS
  Filled 2020-05-31: qty 2

## 2020-05-31 MED ORDER — HYDROMORPHONE HCL 2 MG PO TABS: 2.0000 mg | ORAL_TABLET | ORAL | 0 refills | Status: DC | PRN

## 2020-05-31 MED ORDER — ONDANSETRON 4 MG PO TBDP: 4.0000 mg | ORAL_TABLET | Freq: Once | ORAL | Status: DC | PRN

## 2020-05-31 MED ORDER — ONDANSETRON 8 MG PO TBDP: 8.0000 mg | ORAL_TABLET | Freq: Three times a day (TID) | ORAL | 0 refills | Status: DC | PRN

## 2020-05-31 MED ORDER — TAMSULOSIN HCL 0.4 MG PO CAPS
0.4000 mg | ORAL_CAPSULE | Freq: Once | ORAL | Status: AC
  Administered 2020-05-31: 0.4 mg via ORAL
  Filled 2020-05-31: qty 1

## 2020-05-31 MED ORDER — HYDROMORPHONE HCL 1 MG/ML IJ SOLN
1.0000 mg | Freq: Once | INTRAMUSCULAR | Status: AC | PRN
  Administered 2020-05-31: 1 mg via INTRAVENOUS
  Filled 2020-05-31: qty 1

## 2020-05-31 NOTE — ED Triage Notes (Signed)
Patient complaining of right lower back pain that wraps around to his right lower abdomen. Patient states it started around 12:30 tonight and will not go away.

## 2020-05-31 NOTE — ED Provider Notes (Signed)
[facility] DEPT Provider Note: Georgena Spurling, MD, FACEP  CSN: [id number] MRN: [id number] ARRIVAL: 05/31/20 at [place]: [place]  Flank Pain   HISTORY OF PRESENT ILLNESS  05/31/20 5:18 AM Francisco Fleming is a 69 y.o. male right flank pain radiating to his right lower abdomen that began about 1230 this morning.  It is been constant.  He describes it as aching and sharp and rates it as a 10 out of 10.  It is not worse with movement.  He has had associated nausea but no vomiting.  He has not noticed hematuria.   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  Past Medical History:  Diagnosis Date  . Cataract    removed right eye   . Diabetes mellitus without complication ([place])   . Hypertension     Past Surgical History:  Procedure Laterality Date  . COLONOSCOPY  09/01/2008  . LAMINECTOMY     L3L4  . sinus cyst removed       Family History  Problem Relation Age of Onset  . Colon polyps Neg Hx   . Colon cancer Neg Hx   . Esophageal cancer Neg Hx   . Rectal cancer Neg Hx   . Stomach cancer Neg Hx     Social History   Tobacco Use  . Smoking status: Former Research scientist (life sciences)  . Smokeless tobacco: Former Network engineer Use Topics  . Alcohol use: No    Alcohol/week: 0.0 standard drinks  . Drug use: No    Prior to Admission medications   Medication Sig Start Date End Date Taking? Authorizing Provider  acetaZOLAMIDE (DIAMOX SEQUELS) 500 MG capsule Take 1 capsule (500 mg total) by mouth 2 (two) times daily. 03/25/16   Charlann Lange, PA-C  aspirin 325 MG tablet Take 325 mg by mouth daily.    [provider]  BD PEN NEEDLE NANO U/F 32G X 4 MM MISC USE ONE PEN NEEDLE PER TRESIBA INJECTION DAILY 04/24/19   [provider]  CINNAMON PO Take 1 capsule by mouth every morning.    [provider]  fenofibrate 160 MG tablet Take 160 mg by mouth daily.    [provider]  HYDROmorphone (DILAUDID) 2 MG tablet Take 1  tablet (2 mg total) by mouth every 4 (four) hours as needed for severe pain (may cause constipation). 05/31/20   Alleigh Mollica, MD  insulin detemir (LEVEMIR) 100 UNIT/ML injection Inject 30 Units into the skin daily.    [provider]  latanoprost (XALATAN) 0.005 % ophthalmic solution 1 drop at bedtime. 07/08/19   [provider]  metFORMIN (GLUCOPHAGE) 1000 MG tablet Take 1,000 mg by mouth 2 (two) times daily. 02/22/16   [provider]  Multiple Vitamin (MULTIVITAMIN WITH MINERALS) TABS tablet Take 1 tablet by mouth daily.    [provider]  ondansetron (ZOFRAN ODT) 8 MG disintegrating tablet Take 1 tablet (8 mg total) by mouth every 8 (eight) hours as needed for nausea or vomiting. 05/31/20   Waverly Tarquinio, MD  pioglitazone (ACTOS) 45 MG tablet Take 22.5 mg by mouth daily. 03/16/16   [provider]  rosuvastatin (CRESTOR) 10 MG tablet Take 10 mg by mouth daily. 06/04/19   [provider]  tamsulosin (FLOMAX) 0.4 MG CAPS capsule Take 1 tablet daily until stone passes. 05/31/20   Brezlyn Manrique, MD  telmisartan (MICARDIS) 80 MG tablet Take 80 mg by mouth daily. 05/08/19  [provider]  TRESIBA FLEXTOUCH 100 UNIT/ML SOPN FlexTouch Pen Inject 16 Units into the skin daily.  03/06/19   [provider]    Allergies Penicillins   REVIEW OF SYSTEMS  Negative except as noted here or in the History of Present Illness.   PHYSICAL EXAMINATION  Initial Vital Signs Blood pressure (!) 175/68, pulse 60, temperature 97.6 F (36.4 C), temperature source Oral, resp. rate 16, height 5\' 10"  (1.778 m), weight 77.1 kg, SpO2 99 %.  Examination General: Well-developed, well-nourished male in no acute distress; appearance consistent with age of record HENT: normocephalic; atraumatic Eyes: Right pupil 6 mm, round and fixed; left pupil round, 3 mm and reactive; extraocular muscles intact Neck: supple Heart: regular rate and rhythm Lungs: clear to  auscultation bilaterally Abdomen: soft; nondistended; nontender; bowel sounds present GU: No CVA tenderness Extremities: No deformity; full range of motion; pulses normal Neurologic: Awake, alert and oriented; motor function intact in all extremities and symmetric; no facial droop Skin: Warm and dry Psychiatric: Normal mood and affect   RESULTS  Summary of this visit's results, reviewed and interpreted by myself:   EKG Interpretation  Date/Time:    Ventricular Rate:    PR Interval:    QRS Duration:   QT Interval:    QTC Calculation:   R Axis:     Text Interpretation:        Laboratory Studies: Results for orders placed or performed during the hospital encounter of 05/31/20 (from the past 24 hour(s))  Lipase, blood     Status: None   Collection Time: 05/31/20  3:00 AM  Result Value Ref Range   Lipase 37 11 - 51 U/L  Comprehensive metabolic panel     Status: Abnormal   Collection Time: 05/31/20  3:00 AM  Result Value Ref Range   Sodium 140 135 - 145 mmol/L   Potassium 3.8 3.5 - 5.1 mmol/L   Chloride 103 98 - 111 mmol/L   CO2 24 22 - 32 mmol/L   Glucose, Bld 129 (H) 70 - 99 mg/dL   BUN 27 (H) 8 - 23 mg/dL   Creatinine, Ser 0.99 0.61 - 1.24 mg/dL   Calcium 10.3 8.9 - 10.3 mg/dL   Total Protein 7.9 6.5 - 8.1 g/dL   Albumin 4.8 3.5 - 5.0 g/dL   AST 23 15 - 41 U/L   ALT 26 0 - 44 U/L   Alkaline Phosphatase 80 38 - 126 U/L   Total Bilirubin 0.6 0.3 - 1.2 mg/dL   GFR calc non Af Amer >60 >60 mL/min   GFR calc Af Amer >60 >60 mL/min   Anion gap 13 5 - 15  CBC     Status: Abnormal   Collection Time: 05/31/20  3:00 AM  Result Value Ref Range   WBC 18.1 (H) 4.0 - 10.5 K/uL   RBC 4.88 4.22 - 5.81 MIL/uL   Hemoglobin 16.0 13.0 - 17.0 g/dL   HCT 48.1 39 - 52 %   MCV 98.6 80.0 - 100.0 fL   MCH 32.8 26.0 - 34.0 pg   MCHC 33.3 30.0 - 36.0 g/dL   RDW 12.7 11.5 - 15.5 %   Platelets 223 150 - 400 K/uL   nRBC 0.0 0.0 - 0.2 %  Urinalysis, Routine w reflex microscopic Urine,  Clean Catch     Status: Abnormal   Collection Time: 05/31/20  3:01 AM  Result Value Ref Range   Color, Urine YELLOW YELLOW   APPearance CLEAR CLEAR  Specific Gravity, Urine 1.020 1.005 - 1.030   pH 5.0 5.0 - 8.0   Glucose, UA NEGATIVE NEGATIVE mg/dL   Hgb urine dipstick MODERATE (A) NEGATIVE   Bilirubin Urine NEGATIVE NEGATIVE   Ketones, ur NEGATIVE NEGATIVE mg/dL   Protein, ur NEGATIVE NEGATIVE mg/dL   Nitrite NEGATIVE NEGATIVE   Leukocytes,Ua NEGATIVE NEGATIVE   RBC / HPF 11-20 0 - 5 RBC/hpf   WBC, UA 0-5 0 - 5 WBC/hpf   Bacteria, UA NONE SEEN NONE SEEN   Mucus PRESENT   Glucose, capillary     Status: Abnormal   Collection Time: 05/31/20  3:26 AM  Result Value Ref Range   Glucose-Capillary 158 (H) 70 - 99 mg/dL   Imaging Studies: CT Renal Stone Study  Result Date: 05/31/2020 CLINICAL DATA:  Right flank pain. EXAM: CT ABDOMEN AND PELVIS WITHOUT CONTRAST TECHNIQUE: Multidetector CT imaging of the abdomen and pelvis was performed following the standard protocol without IV contrast. COMPARISON:  None. FINDINGS: Lower chest: 6 mm and 7 mm noncalcified lung nodules are seen within the lateral aspect of the left lung base (axial CT images 10 and 20, CT series number 4). A 4 mm noncalcified lung nodule is also seen along the posterolateral aspect of the right lung base. Hepatobiliary: No focal liver abnormality is seen. No gallstones, gallbladder wall thickening, or biliary dilatation. Pancreas: Unremarkable. No pancreatic ductal dilatation or surrounding inflammatory changes. Spleen: Normal in size without focal abnormality. Adrenals/Urinary Tract: Adrenal glands are unremarkable. Kidneys are normal in size, without focal lesions. A 3 mm obstructing renal stone is seen within the distal right ureter, near the right UVJ, with mild to moderate severity right-sided hydronephrosis, hydroureter and perinephric inflammatory fat stranding. Bladder is unremarkable. Stomach/Bowel: Stomach is within  normal limits. Appendix appears normal. No evidence of bowel wall thickening, distention, or inflammatory changes. Vascular/Lymphatic: There is marked severity calcification of the abdominal aorta and bilateral common iliac arteries, without evidence of aneurysmal dilatation. No enlarged abdominal or pelvic lymph nodes. Reproductive: The prostate gland is mildly enlarged. Other: No abdominal wall hernia or abnormality. No abdominopelvic ascites. Musculoskeletal: No acute or significant osseous findings. IMPRESSION: 1. 3 mm obstructing renal stone within the distal right ureter, near the right UVJ, with mild to moderate severity right-sided hydronephrosis, hydroureter and perinephric inflammatory fat stranding. 2. Bilateral noncalcified lung nodules. Correlation with chest CT is recommended. 3. Mildly enlarged prostate gland. 4. Aortic atherosclerosis. Aortic Atherosclerosis (ICD10-I70.0). Electronically Signed   By: Virgina Norfolk M.D.   On: 05/31/2020 03:40    ED COURSE and MDM  Nursing notes, initial and subsequent vitals signs, including pulse oximetry, reviewed and interpreted by myself.  Vitals:   05/31/20 0259  BP: (!) 175/68  Pulse: 60  Resp: 16  Temp: 97.6 F (36.4 C)  TempSrc: Oral  SpO2: 99%  Weight: 77.1 kg  Height: 5\' 10"  (1.778 m)   Medications  HYDROmorphone (DILAUDID) injection 1 mg (has no administration in time range)  ondansetron (ZOFRAN) injection 4 mg (has no administration in time range)  HYDROmorphone (DILAUDID) injection 1 mg (has no administration in time range)  tamsulosin (FLOMAX) capsule 0.4 mg (has no administration in time range)   6:19 AM Patient's pain improved after IV Dilaudid.  Patient advised of CT findings.  He will likely pass the stone on his own but may need urologic follow-up if symptoms persist.  No evidence of urinary tract infection on urinalysis.   PROCEDURES  Procedures   ED DIAGNOSES  ICD-10-CM   1. Ureterolithiasis  N20.1         Keyauna Graefe, MD 05/31/20 [phone]

## 2020-06-02 DIAGNOSIS — R69 Illness, unspecified: Secondary | ICD-10-CM | POA: Diagnosis not present

## 2020-06-15 DIAGNOSIS — N400 Enlarged prostate without lower urinary tract symptoms: Secondary | ICD-10-CM | POA: Diagnosis not present

## 2020-06-15 DIAGNOSIS — N201 Calculus of ureter: Secondary | ICD-10-CM | POA: Diagnosis not present

## 2020-06-15 DIAGNOSIS — R972 Elevated prostate specific antigen [PSA]: Secondary | ICD-10-CM | POA: Diagnosis not present

## 2020-06-16 DIAGNOSIS — Z20822 Contact with and (suspected) exposure to covid-19: Secondary | ICD-10-CM | POA: Diagnosis not present

## 2020-07-16 DIAGNOSIS — Z20822 Contact with and (suspected) exposure to covid-19: Secondary | ICD-10-CM | POA: Diagnosis not present

## 2020-07-19 DIAGNOSIS — C61 Malignant neoplasm of prostate: Secondary | ICD-10-CM

## 2020-07-19 HISTORY — DX: Malignant neoplasm of prostate: C61

## 2020-07-23 DIAGNOSIS — R972 Elevated prostate specific antigen [PSA]: Secondary | ICD-10-CM | POA: Diagnosis not present

## 2020-07-23 DIAGNOSIS — C61 Malignant neoplasm of prostate: Secondary | ICD-10-CM | POA: Diagnosis not present

## 2020-07-23 DIAGNOSIS — N4232 Atypical small acinar proliferation of prostate: Secondary | ICD-10-CM | POA: Diagnosis not present

## 2020-08-03 DIAGNOSIS — R3914 Feeling of incomplete bladder emptying: Secondary | ICD-10-CM | POA: Diagnosis not present

## 2020-08-03 DIAGNOSIS — R350 Frequency of micturition: Secondary | ICD-10-CM | POA: Diagnosis not present

## 2020-08-03 DIAGNOSIS — R351 Nocturia: Secondary | ICD-10-CM | POA: Diagnosis not present

## 2020-08-03 DIAGNOSIS — C61 Malignant neoplasm of prostate: Secondary | ICD-10-CM | POA: Diagnosis not present

## 2020-08-05 ENCOUNTER — Telehealth: Payer: Self-pay | Admitting: *Deleted

## 2020-08-05 NOTE — Telephone Encounter (Signed)
LVM for callback to schedule appointment with Dr. Tammi Klippel.

## 2020-08-24 DIAGNOSIS — E1169 Type 2 diabetes mellitus with other specified complication: Secondary | ICD-10-CM | POA: Diagnosis not present

## 2020-08-24 DIAGNOSIS — R972 Elevated prostate specific antigen [PSA]: Secondary | ICD-10-CM | POA: Diagnosis not present

## 2020-08-24 DIAGNOSIS — M1990 Unspecified osteoarthritis, unspecified site: Secondary | ICD-10-CM | POA: Diagnosis not present

## 2020-08-24 DIAGNOSIS — I10 Essential (primary) hypertension: Secondary | ICD-10-CM | POA: Diagnosis not present

## 2020-08-24 DIAGNOSIS — E785 Hyperlipidemia, unspecified: Secondary | ICD-10-CM | POA: Diagnosis not present

## 2020-08-26 ENCOUNTER — Encounter: Payer: Self-pay | Admitting: Radiation Oncology

## 2020-08-26 NOTE — Progress Notes (Addendum)
GU Location of Tumor / Histology: prostatic adenocarcinoma  If Prostate Cancer, Gleason Score is (3 + 3) and PSA is (4.7). Prostate volume: 45.25 g  Carolee Rota explains that "they" have been watching his PSA for some time but he is unsure for how long. PCP referred to Dr. Gloriann Loan "then when [facility] left I got Dr. Abner Greenspan, who I really like."   Biopsies of prostate (if applicable) revealed:    Past/Anticipated interventions by urology, if any: prostate biopsy, referral to Dr. Tammi Klippel to discuss brachytherapy  Past/Anticipated interventions by medical oncology, if any: no  Weight changes, if any: no  Bowel/Bladder complaints, if any: IPSS 3. SHIM 25. Denies dysuria, hematuria, urinary leakage or incontinence. Denies any bowel complaints.   Nausea/Vomiting, if any: no  Pain issues, if any:  Reports residual intermittent left shoulder pain from rotator cuff surgery several months ago.  SAFETY ISSUES:  Prior radiation? Yes, as an infant for an enlarge prostate  Pacemaker/ICD? denies  Possible current pregnancy? no, male patient  Is the patient on methotrexate? no  Current Complaints / other details:  69 year old male. Married with one daughter and three sons. Resides in [place].

## 2020-08-27 ENCOUNTER — Ambulatory Visit
Admission: RE | Admit: 2020-08-27 | Discharge: 2020-08-27 | Disposition: A | Discharge status: Home / Self Care | Payer: Medicare HMO | Source: Ambulatory Visit | Attending: Radiation Oncology | Admitting: Radiation Oncology

## 2020-08-27 ENCOUNTER — Other Ambulatory Visit: Payer: Self-pay

## 2020-08-27 ENCOUNTER — Encounter: Payer: Self-pay | Admitting: Medical Oncology

## 2020-08-27 ENCOUNTER — Encounter: Payer: Self-pay | Admitting: Radiation Oncology

## 2020-08-27 VITALS — BP 138/73 | HR 67 | Temp 97.8°F | Resp 18 | Ht 69.5 in | Wt 173.8 lb

## 2020-08-27 DIAGNOSIS — E119 Type 2 diabetes mellitus without complications: Secondary | ICD-10-CM | POA: Diagnosis not present

## 2020-08-27 DIAGNOSIS — F17210 Nicotine dependence, cigarettes, uncomplicated: Secondary | ICD-10-CM | POA: Insufficient documentation

## 2020-08-27 DIAGNOSIS — C61 Malignant neoplasm of prostate: Secondary | ICD-10-CM | POA: Insufficient documentation

## 2020-08-27 DIAGNOSIS — Z800 Family history of malignant neoplasm of digestive organs: Secondary | ICD-10-CM | POA: Diagnosis not present

## 2020-08-27 DIAGNOSIS — I10 Essential (primary) hypertension: Secondary | ICD-10-CM | POA: Insufficient documentation

## 2020-08-27 DIAGNOSIS — R69 Illness, unspecified: Secondary | ICD-10-CM | POA: Diagnosis not present

## 2020-08-27 DIAGNOSIS — Z79899 Other long term (current) drug therapy: Secondary | ICD-10-CM | POA: Diagnosis not present

## 2020-08-27 HISTORY — DX: Malignant neoplasm of prostate: C61

## 2020-08-27 NOTE — Progress Notes (Signed)
Radiation Oncology         [phone] ________________________________  Initial outpatient Consultation  Name: Francisco Fleming MRN: [id number]  Date: 08/27/2020  DOB: [birth year]  GE:XBMW, Ravisankar, MD  Janith Lima, MD   REFERRING PHYSICIAN: Janith Lima, MD  DIAGNOSIS: 69 y.o. gentleman with Stage T1c adenocarcinoma of the prostate with Gleason score of 3+3, and PSA of 4.7.    ICD-10-CM   1. Malignant neoplasm of prostate ([place])  C61     HISTORY OF PRESENT ILLNESS: Francisco Fleming is a 69 y.o. male with a diagnosis of prostate cancer. He initially presented to the ED on 05/31/2020 with severe right-sided flank pain. He was found to have a kidney stone, which he subsequently passed. On 06/15/2020, he was referred to Dr. Gloriann Loan for follow up care, as well as for a history of an elevated PSA since 11/2019. PSA was 4.29 in 11/2019 and 4.7 in 04/2020.  Digital rectal examination was performed at that time revealing no nodules.  The patient proceeded to transrectal ultrasound with 12 biopsies of the prostate on 07/23/2020 under Dr. Abner Greenspan.  The prostate volume measured 45.25 cc.  Out of 12 core biopsies, 4 were positive.  The maximum Gleason score was 3+3, and this was seen in right mid lateral, right base lateral, right base, and left apex lateral.  The patient reviewed the biopsy results with his urologist and he has kindly been referred today for discussion of potential radiation treatment options.   PREVIOUS RADIATION THERAPY: No  PAST MEDICAL HISTORY:  Past Medical History:  Diagnosis Date  . Cataract    removed right eye   . Diabetes mellitus without complication ([place])   . Hypertension   . Prostate cancer ([place])       PAST SURGICAL HISTORY: Past Surgical History:  Procedure Laterality Date  . COLONOSCOPY  09/01/2008  . LAMINECTOMY     L3L4  . ROTATOR CUFF REPAIR Left    re tached bicep and tendon as well  . sinus cyst removed       FAMILY HISTORY:  Family History  Problem  Relation Age of Onset  . Colon polyps Mother   . Cervical cancer Mother   . Lung cancer Brother        non smoker. served on nuclear carriers in the [employer].  . Esophageal cancer Neg Hx   . Rectal cancer Neg Hx   . Stomach cancer Neg Hx   . Breast cancer Neg Hx   . Pancreatic cancer Neg Hx   . Prostate cancer Neg Hx   . Colon cancer Neg Hx     SOCIAL HISTORY:  Social History   Socioeconomic History  . Marital status: Married    Spouse name: Not on file  . Number of children: 4  . Years of education: Not on file  . Highest education level: Not on file  Occupational History  . Not on file  Tobacco Use  . Smoking status: Light Tobacco Smoker    Types: Cigars  . Smokeless tobacco: Former Systems developer    Quit date: 09/18/1997  . Tobacco comment: smokes cigars occasionally when playing golf  Vaping Use  . Vaping Use: Never used  Substance and Sexual Activity  . Alcohol use: No    Alcohol/week: 0.0 standard drinks  . Drug use: No  . Sexual activity: Yes  Other Topics Concern  . Not on file  Social History Narrative  . Not on file   Social Determinants  of Health   Financial Resource Strain: Not on file  Food Insecurity: Not on file  Transportation Needs: Not on file  Physical Activity: Not on file  Stress: Not on file  Social Connections: Not on file  Intimate Partner Violence: Not on file    ALLERGIES: Penicillins  MEDICATIONS:  Current Outpatient Medications  Medication Sig Dispense Refill  . amLODipine (NORVASC) 2.5 MG tablet Take 2.5 mg by mouth daily.    . BD PEN NEEDLE NANO U/F 32G X 4 MM MISC USE ONE PEN NEEDLE PER TRESIBA INJECTION DAILY    . latanoprost (XALATAN) 0.005 % ophthalmic solution 1 drop at bedtime.    . metFORMIN (GLUCOPHAGE) 1000 MG tablet Take 1,000 mg by mouth 2 (two) times daily.  11  . Multiple Vitamin (MULTIVITAMIN WITH MINERALS) TABS tablet Take 1 tablet by mouth daily.    . pioglitazone (ACTOS) 45 MG tablet Take 22.5 mg by mouth daily.  11  .  rosuvastatin (CRESTOR) 10 MG tablet Take 10 mg by mouth daily.    Marland Kitchen telmisartan (MICARDIS) 80 MG tablet Take 80 mg by mouth daily.    . TRESIBA FLEXTOUCH 100 UNIT/ML SOPN FlexTouch Pen Inject 16 Units into the skin daily.      No current facility-administered medications for this encounter.    REVIEW OF SYSTEMS:  On review of systems, the patient reports that he is doing well overall. He denies any chest pain, shortness of breath, cough, fevers, chills, night sweats, unintended weight changes. He denies any bowel disturbances, and denies abdominal pain, nausea or vomiting. He denies any new musculoskeletal or joint aches or pains. His IPSS was 3, indicating mild urinary symptoms. His SHIM was 25, indicating he no erectile dysfunction. A complete review of systems is obtained and is otherwise negative.    PHYSICAL EXAM:  Wt Readings from Last 3 Encounters:  08/27/20 173 lb 12.8 oz (78.8 kg)  05/31/20 170 lb (77.1 kg)  08/07/19 176 lb (79.8 kg)   Temp Readings from Last 3 Encounters:  08/27/20 97.8 F (36.6 C)  05/31/20 97.6 F (36.4 C) (Oral)  08/07/19 98 F (36.7 C)   BP Readings from Last 3 Encounters:  08/27/20 138/73  05/31/20 (!) 163/62  08/07/19 119/60   Pulse Readings from Last 3 Encounters:  08/27/20 67  05/31/20 86  08/07/19 60   Pain Assessment Pain Score: 1  Pain Frequency: Intermittent Pain Loc: Shoulder (related to effects of rotator cuff surgery)/10  In general this is a well appearing man in no acute distress. He's alert and oriented x4 and appropriate throughout the examination. Cardiopulmonary assessment is negative for acute distress and he exhibits normal effort.   KPS = 100  100 - Normal; no complaints; no evidence of disease. 90   - Able to carry on normal activity; minor signs or symptoms of disease. 80   - Normal activity with effort; some signs or symptoms of disease. 68   - Cares for self; unable to carry on normal activity or to do active  work. 60   - Requires occasional assistance, but is able to care for most of his personal needs. 50   - Requires considerable assistance and frequent medical care. 66   - Disabled; requires special care and assistance. 39   - Severely disabled; hospital admission is indicated although death not imminent. 77   - Very sick; hospital admission necessary; active supportive treatment necessary. 10   - Moribund; fatal processes progressing rapidly. 0     -  Dead  Karnofsky DA, Abelmann [place], Craver LS and Burchenal [facility] [phone]) The use of the nitrogen mustards in the palliative treatment of carcinoma: with particular reference to bronchogenic carcinoma Cancer 1 634-56  LABORATORY DATA:  Lab Results  Component Value Date   WBC 18.1 (H) 05/31/2020   HGB 16.0 05/31/2020   HCT 48.1 05/31/2020   MCV 98.6 05/31/2020   PLT 223 05/31/2020   Lab Results  Component Value Date   NA 140 05/31/2020   K 3.8 05/31/2020   CL 103 05/31/2020   CO2 24 05/31/2020   Lab Results  Component Value Date   ALT 26 05/31/2020   AST 23 05/31/2020   ALKPHOS 80 05/31/2020   BILITOT 0.6 05/31/2020     RADIOGRAPHY: No results found.    IMPRESSION/PLAN: 1. 69 y.o. gentleman with Stage T1c adenocarcinoma of the prostate with Gleason Score of 3+3, and PSA of 4.7. We discussed the patient's workup and outlined the nature of prostate cancer in this setting. The patient's T stage, Gleason's score, and PSA put him into the low risk group. Accordingly, he is eligible for a variety of potential treatment options including active surveillance, brachytherapy, 5.5 weeks of external radiation, or prostatectomy. We discussed the available radiation techniques, and focused on the details and logistics of delivery. We discussed and outlined the risks, benefits, short and long-term effects associated with radiotherapy and compared and contrasted these with prostatectomy. We discussed the role of SpaceOAR gel in reducing the rectal  toxicity associated with radiotherapy.  He appears to have a good understanding of his disease and our treatment recommendations which are of curative intent.  He was encouraged to ask questions that were answered to his stated satisfaction.  At the conclusion of our conversation, the patient is interested in moving forward with active surveillance, and leaning toward definitive LDR brachytherapy in the future, if treatment is warranted.  He did indicate that he may need some sedation like Valium if he undergoes another TRUS and biopsy in the future.  I spent 60 minutes on today's encounter.   ------------------------------------------------   Tyler Pita, MD [facility]  Radiation Oncology Direct Dial: [phone]  Fax: [phone] [email or web address]  Skype  [email or web address]   This document serves as a record of services personally performed by Tyler Pita, MD. It was created on his behalf by Wilburn Mylar, a trained medical scribe. The creation of this record is based on the scribe's personal observations and the provider's statements to them. This document has been checked and approved by the attending provider.

## 2020-09-20 DIAGNOSIS — E119 Type 2 diabetes mellitus without complications: Secondary | ICD-10-CM | POA: Diagnosis not present

## 2020-09-20 DIAGNOSIS — Z961 Presence of intraocular lens: Secondary | ICD-10-CM | POA: Diagnosis not present

## 2020-09-20 DIAGNOSIS — H4031X4 Glaucoma secondary to eye trauma, right eye, indeterminate stage: Secondary | ICD-10-CM | POA: Diagnosis not present

## 2020-09-20 DIAGNOSIS — H2512 Age-related nuclear cataract, left eye: Secondary | ICD-10-CM | POA: Diagnosis not present

## 2020-11-03 DIAGNOSIS — C61 Malignant neoplasm of prostate: Secondary | ICD-10-CM | POA: Diagnosis not present

## 2020-11-25 DIAGNOSIS — E119 Type 2 diabetes mellitus without complications: Secondary | ICD-10-CM | POA: Diagnosis not present

## 2020-11-25 DIAGNOSIS — H2512 Age-related nuclear cataract, left eye: Secondary | ICD-10-CM | POA: Diagnosis not present

## 2020-11-25 DIAGNOSIS — H4031X4 Glaucoma secondary to eye trauma, right eye, indeterminate stage: Secondary | ICD-10-CM | POA: Diagnosis not present

## 2020-12-09 DIAGNOSIS — Z125 Encounter for screening for malignant neoplasm of prostate: Secondary | ICD-10-CM | POA: Diagnosis not present

## 2020-12-09 DIAGNOSIS — E785 Hyperlipidemia, unspecified: Secondary | ICD-10-CM | POA: Diagnosis not present

## 2020-12-09 DIAGNOSIS — E1169 Type 2 diabetes mellitus with other specified complication: Secondary | ICD-10-CM | POA: Diagnosis not present

## 2020-12-17 DIAGNOSIS — Z0000 Encounter for general adult medical examination without abnormal findings: Secondary | ICD-10-CM | POA: Diagnosis not present

## 2020-12-17 DIAGNOSIS — H4030X0 Glaucoma secondary to eye trauma, unspecified eye, stage unspecified: Secondary | ICD-10-CM | POA: Diagnosis not present

## 2020-12-17 DIAGNOSIS — I10 Essential (primary) hypertension: Secondary | ICD-10-CM | POA: Diagnosis not present

## 2020-12-17 DIAGNOSIS — E785 Hyperlipidemia, unspecified: Secondary | ICD-10-CM | POA: Diagnosis not present

## 2020-12-17 DIAGNOSIS — M1990 Unspecified osteoarthritis, unspecified site: Secondary | ICD-10-CM | POA: Diagnosis not present

## 2020-12-17 DIAGNOSIS — R82998 Other abnormal findings in urine: Secondary | ICD-10-CM | POA: Diagnosis not present

## 2020-12-17 DIAGNOSIS — C61 Malignant neoplasm of prostate: Secondary | ICD-10-CM | POA: Diagnosis not present

## 2020-12-17 DIAGNOSIS — E1169 Type 2 diabetes mellitus with other specified complication: Secondary | ICD-10-CM | POA: Diagnosis not present

## 2020-12-17 DIAGNOSIS — D126 Benign neoplasm of colon, unspecified: Secondary | ICD-10-CM | POA: Diagnosis not present

## 2020-12-17 DIAGNOSIS — Z13828 Encounter for screening for other musculoskeletal disorder: Secondary | ICD-10-CM | POA: Diagnosis not present

## 2021-01-05 DIAGNOSIS — D485 Neoplasm of uncertain behavior of skin: Secondary | ICD-10-CM | POA: Diagnosis not present

## 2021-01-05 DIAGNOSIS — L218 Other seborrheic dermatitis: Secondary | ICD-10-CM | POA: Diagnosis not present

## 2021-01-05 DIAGNOSIS — L578 Other skin changes due to chronic exposure to nonionizing radiation: Secondary | ICD-10-CM | POA: Diagnosis not present

## 2021-01-05 DIAGNOSIS — L570 Actinic keratosis: Secondary | ICD-10-CM | POA: Diagnosis not present

## 2021-01-05 DIAGNOSIS — D225 Melanocytic nevi of trunk: Secondary | ICD-10-CM | POA: Diagnosis not present

## 2021-01-19 DIAGNOSIS — L570 Actinic keratosis: Secondary | ICD-10-CM | POA: Diagnosis not present

## 2021-01-24 DIAGNOSIS — C61 Malignant neoplasm of prostate: Secondary | ICD-10-CM | POA: Diagnosis not present

## 2021-01-31 DIAGNOSIS — R351 Nocturia: Secondary | ICD-10-CM | POA: Diagnosis not present

## 2021-01-31 DIAGNOSIS — R3914 Feeling of incomplete bladder emptying: Secondary | ICD-10-CM | POA: Diagnosis not present

## 2021-01-31 DIAGNOSIS — C61 Malignant neoplasm of prostate: Secondary | ICD-10-CM | POA: Diagnosis not present

## 2021-03-03 DIAGNOSIS — L738 Other specified follicular disorders: Secondary | ICD-10-CM | POA: Diagnosis not present

## 2021-03-03 DIAGNOSIS — L578 Other skin changes due to chronic exposure to nonionizing radiation: Secondary | ICD-10-CM | POA: Diagnosis not present

## 2021-04-08 ENCOUNTER — Other Ambulatory Visit: Payer: Self-pay | Admitting: Urology

## 2021-04-08 DIAGNOSIS — C61 Malignant neoplasm of prostate: Secondary | ICD-10-CM

## 2021-05-01 ENCOUNTER — Other Ambulatory Visit: Payer: Self-pay

## 2021-05-01 ENCOUNTER — Ambulatory Visit
Admission: RE | Admit: 2021-05-01 | Discharge: 2021-05-01 | Disposition: A | Discharge status: Home / Self Care | Payer: Medicare HMO | Source: Ambulatory Visit | Attending: Urology | Admitting: Urology

## 2021-05-01 DIAGNOSIS — R590 Localized enlarged lymph nodes: Secondary | ICD-10-CM | POA: Diagnosis not present

## 2021-05-01 DIAGNOSIS — C61 Malignant neoplasm of prostate: Secondary | ICD-10-CM

## 2021-05-01 MED ORDER — GADOBENATE DIMEGLUMINE 529 MG/ML IV SOLN
15.0000 mL | Freq: Once | INTRAVENOUS | Status: AC | PRN
  Administered 2021-05-01: 15 mL via INTRAVENOUS

## 2021-05-04 DIAGNOSIS — C61 Malignant neoplasm of prostate: Secondary | ICD-10-CM | POA: Diagnosis not present

## 2021-05-04 DIAGNOSIS — M1990 Unspecified osteoarthritis, unspecified site: Secondary | ICD-10-CM | POA: Diagnosis not present

## 2021-05-04 DIAGNOSIS — D126 Benign neoplasm of colon, unspecified: Secondary | ICD-10-CM | POA: Diagnosis not present

## 2021-05-04 DIAGNOSIS — E785 Hyperlipidemia, unspecified: Secondary | ICD-10-CM | POA: Diagnosis not present

## 2021-05-04 DIAGNOSIS — E1169 Type 2 diabetes mellitus with other specified complication: Secondary | ICD-10-CM | POA: Diagnosis not present

## 2021-05-04 DIAGNOSIS — H4030X0 Glaucoma secondary to eye trauma, unspecified eye, stage unspecified: Secondary | ICD-10-CM | POA: Diagnosis not present

## 2021-05-04 DIAGNOSIS — I10 Essential (primary) hypertension: Secondary | ICD-10-CM | POA: Diagnosis not present

## 2021-05-25 DIAGNOSIS — H4031X4 Glaucoma secondary to eye trauma, right eye, indeterminate stage: Secondary | ICD-10-CM | POA: Diagnosis not present

## 2021-05-25 DIAGNOSIS — H5213 Myopia, bilateral: Secondary | ICD-10-CM | POA: Diagnosis not present

## 2021-05-26 DIAGNOSIS — C61 Malignant neoplasm of prostate: Secondary | ICD-10-CM | POA: Diagnosis not present

## 2021-06-07 DIAGNOSIS — R3914 Feeling of incomplete bladder emptying: Secondary | ICD-10-CM | POA: Diagnosis not present

## 2021-06-07 DIAGNOSIS — C61 Malignant neoplasm of prostate: Secondary | ICD-10-CM | POA: Diagnosis not present

## 2021-06-07 DIAGNOSIS — N400 Enlarged prostate without lower urinary tract symptoms: Secondary | ICD-10-CM | POA: Diagnosis not present

## 2021-06-10 ENCOUNTER — Telehealth: Payer: Self-pay | Admitting: Radiation Oncology

## 2021-06-18 DIAGNOSIS — Z8616 Personal history of COVID-19: Secondary | ICD-10-CM

## 2021-06-18 HISTORY — DX: Personal history of COVID-19: Z86.16

## 2021-06-24 ENCOUNTER — Telehealth: Payer: Self-pay | Admitting: *Deleted

## 2021-06-24 ENCOUNTER — Encounter: Payer: Self-pay | Admitting: Urology

## 2021-06-24 ENCOUNTER — Ambulatory Visit
Admission: RE | Admit: 2021-06-24 | Discharge: 2021-06-24 | Disposition: A | Discharge status: Home / Self Care | Payer: Medicare HMO | Source: Ambulatory Visit | Attending: Urology | Admitting: Urology

## 2021-06-24 DIAGNOSIS — C61 Malignant neoplasm of prostate: Secondary | ICD-10-CM

## 2021-06-24 NOTE — Progress Notes (Signed)
Radiation Oncology         [phone] ________________________________  Outpatient Re-Consultation - Conducted via telephone due to current COVID-19  Name: Francisco Fleming MRN: [id number]  Date: 06/24/2021  DOB: [birth year]  ZS:MOLM, Ravisankar, MD  Janith Lima, MD   REFERRING PHYSICIAN: Janith Lima, MD  DIAGNOSIS: 70 y.o. gentleman with Stage T1c adenocarcinoma of the prostate with Gleason score of 3+4, and PSA of 3.33.    ICD-10-CM   1. Malignant neoplasm of prostate ([place])  C61      HISTORY OF PRESENT ILLNESS: Francisco Fleming is a 70 y.o. male with a diagnosis of prostate cancer. He initially presented to the ED on 05/31/2020 with severe right-sided flank pain. He was found to have a kidney stone, which he subsequently passed. On 06/15/2020, he was referred to Dr. Gloriann Loan for follow up care, as well as for a history of an elevated PSA since 11/2019. PSA was 4.29 in 11/2019 and 4.7 in 04/2020.  Digital rectal examination was performed at that time revealing no nodules.  The patient proceeded to transrectal ultrasound with 12 biopsies of the prostate on 07/23/2020 under the care of Dr. Abner Greenspan.  The prostate volume measured 45.25 cc.  Out of 12 core biopsies, 4 were positive.  The maximum Gleason score was 3+3, and this was seen in right mid lateral, right base lateral, right base, and left apex lateral.  We met with the patient on 08/27/20 to discuss treatment options and at that time, he opted to proceed with active surveillance but reported that he was leaning towards brachytherapy for definitive treatment if needed in the future.  His PSA has remained stable.  He underwent surveillance prostate MRI on 05/01/21 showing a PI-RADS 4 lesion in the anterior left mid to apical central gland without evidence of high-grade or macroscopic peripheral zone carcinoma and no locally advanced or pelvic metastatic disease.  He proceeded to MRI fusion prostate biopsy on 05/26/21. PSA from that day showed it to remain  stable to slightly decreased at 3.33. The prostate volume measured 50 cc. Out of 16 core biopsies, 6 were positive. The maximum Gleason score was 3+4, and this was seen in 3 of 4 samples from the ROI MRI lesion. Additionally, Gleason 3+3 was seen in the left apex, right base (small focus), and right apex.  The patient reviewed the biopsy results with his urologist and he has kindly been referred today for discussion of potential radiation treatment options.   PREVIOUS RADIATION THERAPY: No  PAST MEDICAL HISTORY:  Past Medical History:  Diagnosis Date   Cataract    removed right eye    Diabetes mellitus without complication ([place])    Hypertension    Prostate cancer ([place])       PAST SURGICAL HISTORY: Past Surgical History:  Procedure Laterality Date   COLONOSCOPY  09/01/2008   LAMINECTOMY     L3L4   ROTATOR CUFF REPAIR Left    re tached bicep and tendon as well   sinus cyst removed       FAMILY HISTORY:  Family History  Problem Relation Age of Onset   Colon polyps Mother    Cervical cancer Mother    Lung cancer Brother        non smoker. served on nuclear carriers in the [employer].   Esophageal cancer Neg Hx    Rectal cancer Neg Hx    Stomach cancer Neg Hx    Breast cancer Neg Hx  Pancreatic cancer Neg Hx    Prostate cancer Neg Hx    Colon cancer Neg Hx     SOCIAL HISTORY:  Social History   Socioeconomic History   Marital status: Married    Spouse name: Not on file   Number of children: 4   Years of education: Not on file   Highest education level: Not on file  Occupational History   Not on file  Tobacco Use   Smoking status: Light Smoker    Types: Cigars   Smokeless tobacco: Former    Quit date: 09/18/1997   Tobacco comments:    smokes cigars occasionally when playing golf  Vaping Use   Vaping Use: Never used  Substance and Sexual Activity   Alcohol use: No    Alcohol/week: 0.0 standard drinks   Drug use: No   Sexual activity: Yes  Other Topics Concern    Not on file  Social History Narrative   Not on file   Social Determinants of Health   Financial Resource Strain: Not on file  Food Insecurity: Not on file  Transportation Needs: Not on file  Physical Activity: Not on file  Stress: Not on file  Social Connections: Not on file  Intimate Partner Violence: Not on file    ALLERGIES: Penicillins  MEDICATIONS:  Current Outpatient Medications  Medication Sig Dispense Refill   amLODipine (NORVASC) 2.5 MG tablet Take 2.5 mg by mouth daily.     BD PEN NEEDLE NANO U/F 32G X 4 MM MISC USE ONE PEN NEEDLE PER TRESIBA INJECTION DAILY     latanoprost (XALATAN) 0.005 % ophthalmic solution 1 drop at bedtime.     metFORMIN (GLUCOPHAGE) 1000 MG tablet Take 1,000 mg by mouth 2 (two) times daily.  11   Multiple Vitamin (MULTIVITAMIN WITH MINERALS) TABS tablet Take 1 tablet by mouth daily.     pioglitazone (ACTOS) 45 MG tablet Take 22.5 mg by mouth daily.  11   rosuvastatin (CRESTOR) 10 MG tablet Take 10 mg by mouth daily.     telmisartan (MICARDIS) 80 MG tablet Take 80 mg by mouth daily.     TRESIBA FLEXTOUCH 100 UNIT/ML SOPN FlexTouch Pen Inject 16 Units into the skin daily.      No current facility-administered medications for this encounter.    REVIEW OF SYSTEMS:  On review of systems, the patient reports that he is doing well overall. He denies any chest pain, shortness of breath, cough, fevers, chills, night sweats, unintended weight changes. He denies any bowel disturbances, and denies abdominal pain, nausea or vomiting. He denies any new musculoskeletal or joint aches or pains. His IPSS was stable at 3, indicating mild urinary symptoms. His previous SHIM was 25, and he confirms that he has no significant erectile dysfunction. A complete review of systems is obtained and is otherwise negative.    PHYSICAL EXAM:  Wt Readings from Last 3 Encounters:  08/27/20 173 lb 12.8 oz (78.8 kg)  05/31/20 170 lb (77.1 kg)  08/07/19 176 lb (79.8 kg)   Temp  Readings from Last 3 Encounters:  08/27/20 97.8 F (36.6 C)  05/31/20 97.6 F (36.4 C) (Oral)  08/07/19 98 F (36.7 C)   BP Readings from Last 3 Encounters:  08/27/20 138/73  05/31/20 (!) 163/62  08/07/19 119/60   Pulse Readings from Last 3 Encounters:  08/27/20 67  05/31/20 86  08/07/19 60   Pain Assessment Pain Score: 3  (general joint pain (Covid))/10  Unable to assess due to telephone  consult visit format.  KPS = 100  100 - Normal; no complaints; no evidence of disease. 90   - Able to carry on normal activity; minor signs or symptoms of disease. 80   - Normal activity with effort; some signs or symptoms of disease. 59   - Cares for self; unable to carry on normal activity or to do active work. 60   - Requires occasional assistance, but is able to care for most of his personal needs. 50   - Requires considerable assistance and frequent medical care. 72   - Disabled; requires special care and assistance. 3   - Severely disabled; hospital admission is indicated although death not imminent. 13   - Very sick; hospital admission necessary; active supportive treatment necessary. 10   - Moribund; fatal processes progressing rapidly. 0     - Dead  Karnofsky DA, Abelmann [place], Craver LS and Burchenal [facility] [phone]) The use of the nitrogen mustards in the palliative treatment of carcinoma: with particular reference to bronchogenic carcinoma Cancer 1 634-56  LABORATORY DATA:  Lab Results  Component Value Date   WBC 18.1 (H) 05/31/2020   HGB 16.0 05/31/2020   HCT 48.1 05/31/2020   MCV 98.6 05/31/2020   PLT 223 05/31/2020   Lab Results  Component Value Date   NA 140 05/31/2020   K 3.8 05/31/2020   CL 103 05/31/2020   CO2 24 05/31/2020   Lab Results  Component Value Date   ALT 26 05/31/2020   AST 23 05/31/2020   ALKPHOS 80 05/31/2020   BILITOT 0.6 05/31/2020     RADIOGRAPHY: No results found.    IMPRESSION/PLAN: This visit was conducted via telephone due to the patient  testing positive for Covid-19. 1. 70 y.o. gentleman with Stage T1c adenocarcinoma of the prostate with Gleason Score of 3+4, and PSA of 3.3.  We discussed the patient's workup and outlined the nature of prostate cancer in this setting. The patient's T stage, Gleason's score, and PSA have moved him into the favorable intermediate risk group. Accordingly, he is eligible for a variety of potential treatment options including continuing in active surveillance, brachytherapy, 5.5 weeks of external radiation, or prostatectomy.  We discussed that active surveillance could still be a reasonable option but it is not a recommended option given the evidence of disease progression to intermediate risk at this time.  We discussed the available radiation techniques, and focused on the details and logistics of delivery. We discussed and outlined the risks, benefits, short and long-term effects associated with radiotherapy and compared and contrasted these with prostatectomy. We discussed the role of SpaceOAR gel in reducing the rectal toxicity associated with radiotherapy.  He appears to have a good understanding of his disease and our treatment recommendations which are of curative intent.  He was encouraged to ask questions that were answered to his stated satisfaction.  At the conclusion of our conversation, the patient is interested in moving forward with brachytherapy and use of SpaceOAR gel to reduce rectal toxicity from radiotherapy.  We will share our discussion with Dr. Abner Greenspan and move forward with scheduling his CT [facility] planning appointment in the near future.  The patient will be contacted by Romie Jumper in our office who will be working closely with him to coordinate OR scheduling and pre and post procedure appointments.  We will contact the pharmaceutical rep to ensure that [place] is available at the time of procedure.  We enjoyed meeting him today and look forward to continuing  to participate in his  care.   Given patient's recent Covid-19 infection, this encounter was conducted via telephone. The patient was notified in advance and was offered a [place] meeting to allow for face to face communication but unfortunately reported that he did not have the appropriate resources/technology to support such a visit and instead preferred to proceed with telephone consult. The patient has given verbal consent for this type of encounter. The attendants for this meeting include Tyler Pita MD, Andi Devon, patient, Francisco Fleming. During the encounter, Tyler Pita MD and Freeman Caldron PA-C were located at [facility] Radiation Oncology Department.  Patient, Francisco Fleming was located at home.   We personally spent 60 minutes in this encounter including chart review, reviewing radiological studies, in telephone conversation with the patient, entering orders, coordinating his care and completing documentation.    Nicholos Johns, PA-C    Tyler Pita, MD  [facility] Direct Dial: [phone]  Fax: [phone] .[email or web address]  Skype  [email or web address]   This document serves as a record of services personally performed by Tyler Pita, MD and Freeman Caldron, PA-C. It was created on their behalf by Wilburn Mylar, a trained medical scribe. The creation of this record is based on the scribe's personal observations and the provider's statements to them. This document has been checked and approved by the attending provider.

## 2021-06-24 NOTE — Progress Notes (Addendum)
Patient diagnosed w/ Covid-19 and could not come in for his consult today. Consult completed via phone call.   Patient reports doing well. No symptoms reported at this time. I-PSS Score of 3 (mild).  No urinary management medications taken at this time. Urology follow-up w/ [facility] for October 3rd canceled due to recent Covid diagnosis and will be rescheduled. Date is TBD.  Meaningful use complete.  Patient is aware of 9:00am-06/24/21 telephone consult (due to Covid Diagnosis) and understands.

## 2021-06-24 NOTE — Telephone Encounter (Signed)
CALLED PATIENT TO ASK QUESTIONS, SPOKE WITH PATIENT 

## 2021-06-28 ENCOUNTER — Telehealth: Payer: Self-pay | Admitting: *Deleted

## 2021-06-28 NOTE — Telephone Encounter (Signed)
CALLED PATIENT TO UPDATE, SPOKE WITH PATIENT AND HE IS AWARE OF HIS PRE-SEED APPTS. ON 07-21-21

## 2021-06-29 DIAGNOSIS — Z0100 Encounter for examination of eyes and vision without abnormal findings: Secondary | ICD-10-CM | POA: Diagnosis not present

## 2021-07-07 ENCOUNTER — Other Ambulatory Visit: Payer: Self-pay | Admitting: Urology

## 2021-07-07 DIAGNOSIS — C61 Malignant neoplasm of prostate: Secondary | ICD-10-CM

## 2021-07-08 ENCOUNTER — Other Ambulatory Visit: Payer: Self-pay | Admitting: Urology

## 2021-07-08 ENCOUNTER — Telehealth: Payer: Self-pay | Admitting: *Deleted

## 2021-07-08 NOTE — Telephone Encounter (Signed)
CALLED PATIENT TO INFORM OF PRE-SEED APPTS. FOR 07-21-21 AND HIS IMPLANT DATE OF 08-22-21, LVM FOR A RETURN CALL

## 2021-07-20 ENCOUNTER — Telehealth: Payer: Self-pay | Admitting: *Deleted

## 2021-07-20 NOTE — Telephone Encounter (Signed)
CALLED PATIENT TO REMIND OF PRE-SEED APPTS. FOR 07-21-21, SPOKE WITH PATIENT AND HE IS AWARE OF THESE APPTS.

## 2021-07-20 NOTE — Progress Notes (Signed)
  Radiation Oncology         [phone] ________________________________  Name: Francisco Fleming MRN: [id number]  Date: 07/21/2021  DOB: [birth year]  SIMULATION AND TREATMENT PLANNING NOTE PUBIC ARCH STUDY  CR:FVOH, Steva Ready, MD  Janith Lima, MD  DIAGNOSIS: 70 y.o. gentleman with Stage T1c adenocarcinoma of the prostate with Gleason score of 3+4, and PSA of 3.33.  Oncology History  Malignant neoplasm of prostate ([place])  08/27/2020 Initial Diagnosis   Malignant neoplasm of prostate ([place])   05/26/2021 Cancer Staging   Staging form: Prostate, AJCC 8th Edition - Clinical stage from 05/26/2021: Stage IIB (cT1c, cN0, cM0, PSA: 3.3, Grade Group: 2) - Signed by Freeman Caldron, PA-C on 06/24/2021 Histopathologic type: Adenocarcinoma, NOS Stage prefix: Initial diagnosis Prostate specific antigen (PSA) range: Less than 10 Gleason primary pattern: 3 Gleason secondary pattern: 4 Gleason score: 7 Histologic grading system: 5 grade system Number of biopsy cores examined: 16 Number of biopsy cores positive: 6 Location of positive needle core biopsies: Both sides        ICD-10-CM   1. Malignant neoplasm of prostate ([place])  C61       COMPLEX SIMULATION:  The patient presented today for evaluation for possible prostate seed implant. He was brought to the radiation planning suite and placed supine on the CT couch. A 3-dimensional image study set was obtained in upload to the planning computer. There, on each axial slice, I contoured the prostate gland. Then, using three-dimensional radiation planning tools I reconstructed the prostate in view of the structures from the transperineal needle pathway to assess for possible pubic arch interference. In doing so, I did not appreciate any pubic arch interference. Also, the patient's prostate volume was estimated based on the drawn structure. The volume was 48 cc.  Given the pubic arch appearance and prostate volume, patient remains a good candidate to  proceed with prostate seed implant. Today, he freely provided informed written consent to proceed.    PLAN: The patient will undergo prostate seed implant.   ________________________________  Francisco Fleming. Tammi Klippel, M.D.

## 2021-07-21 ENCOUNTER — Encounter: Payer: Self-pay | Admitting: Urology

## 2021-07-21 ENCOUNTER — Ambulatory Visit
Admission: RE | Admit: 2021-07-21 | Discharge: 2021-07-21 | Disposition: A | Discharge status: Home / Self Care | Payer: Medicare HMO | Source: Ambulatory Visit | Attending: Urology | Admitting: Urology

## 2021-07-21 ENCOUNTER — Encounter (HOSPITAL_COMMUNITY)
Admission: RE | Admit: 2021-07-21 | Discharge: 2021-07-21 | Disposition: A | Discharge status: Home / Self Care | Payer: Medicare HMO | Source: Ambulatory Visit | Attending: Urology | Admitting: Urology

## 2021-07-21 ENCOUNTER — Ambulatory Visit
Admission: RE | Admit: 2021-07-21 | Discharge: 2021-07-21 | Disposition: A | Discharge status: Home / Self Care | Payer: Medicare HMO | Source: Ambulatory Visit | Attending: Radiation Oncology | Admitting: Radiation Oncology

## 2021-07-21 ENCOUNTER — Other Ambulatory Visit: Payer: Self-pay

## 2021-07-21 ENCOUNTER — Ambulatory Visit (HOSPITAL_COMMUNITY)
Admission: RE | Admit: 2021-07-21 | Discharge: 2021-07-21 | Disposition: A | Discharge status: Home / Self Care | Payer: Medicare HMO | Source: Ambulatory Visit | Attending: Urology | Admitting: Urology

## 2021-07-21 DIAGNOSIS — Z01818 Encounter for other preprocedural examination: Secondary | ICD-10-CM | POA: Diagnosis not present

## 2021-07-21 DIAGNOSIS — C61 Malignant neoplasm of prostate: Secondary | ICD-10-CM | POA: Diagnosis not present

## 2021-07-21 DIAGNOSIS — I700 Atherosclerosis of aorta: Secondary | ICD-10-CM | POA: Diagnosis not present

## 2021-07-21 NOTE — Progress Notes (Signed)
Patient states doing well. No symptoms reported at this time.  Meaningful use complete.  No current urinary management medications. Urology follow-up scheduled for December 19th, 2022 -per patient.  There were no vitals taken for this visit.

## 2021-07-29 ENCOUNTER — Other Ambulatory Visit: Payer: Self-pay

## 2021-07-29 ENCOUNTER — Emergency Department (INDEPENDENT_AMBULATORY_CARE_PROVIDER_SITE_OTHER)
Admission: RE | Admit: 2021-07-29 | Discharge: 2021-07-29 | Disposition: A | Discharge status: Home / Self Care | Payer: Medicare HMO | Source: Ambulatory Visit | Attending: Family Medicine | Admitting: Family Medicine

## 2021-07-29 VITALS — BP 115/65 | HR 66 | Temp 98.2°F | Resp 18 | Ht 69.0 in | Wt 166.0 lb

## 2021-07-29 DIAGNOSIS — J0100 Acute maxillary sinusitis, unspecified: Secondary | ICD-10-CM | POA: Diagnosis not present

## 2021-07-29 MED ORDER — AMOXICILLIN-POT CLAVULANATE 875-125 MG PO TABS: 1.0000 | ORAL_TABLET | Freq: Two times a day (BID) | ORAL | 0 refills | Status: AC

## 2021-07-29 NOTE — ED Triage Notes (Signed)
Congestion, dry cough x 10 days- chills, low grade fever, slight joint pain yesterday, none today. Vaccinated for Covid and Flu

## 2021-07-29 NOTE — ED Provider Notes (Signed)
[facility]    CSN: [id number] Arrival date & time: 07/29/21  1446      History   Chief Complaint No chief complaint on file.   HPI Francisco Fleming is a 70 y.o. male.   Patient complains of onset of mild URI symptoms about 10 days ago.  He has had persistent nasal congestion, and during the past 4 to 5 days he has felt worse with low grade fever, myalgias, and increased nasal congestion and pressure.  He has had a persistent mild non-productive cough but denies pleuritic pain or shortness of breath. Review of records reveals a normal chest x-ray done 07/21/21 as pre-op assessment for prostate seed implants.  The history is provided by the patient.   Past Medical History:  Diagnosis Date   Cataract    removed right eye    Diabetes mellitus without complication ([place])    Hypertension    Prostate cancer [facility])     Patient Active Problem List   Diagnosis Date Noted   Malignant neoplasm of prostate ([place]) 08/27/2020   COLONIC POLYPS 08/24/2008   DM 08/24/2008   HYPERLIPIDEMIA 08/24/2008   HYPERTENSION 08/24/2008    Past Surgical History:  Procedure Laterality Date   COLONOSCOPY  09/01/2008   LAMINECTOMY     L3L4   ROTATOR CUFF REPAIR Left    re tached bicep and tendon as well   sinus cyst removed          Home Medications    Prior to Admission medications   Medication Sig Start Date End Date Taking? Authorizing Provider  amoxicillin-clavulanate (AUGMENTIN) 875-125 MG tablet Take 1 tablet by mouth every 12 (twelve) hours for 10 days. Take with food. 07/29/21 08/08/21 Yes Kandra Nicolas, MD  amLODipine (NORVASC) 2.5 MG tablet Take 2.5 mg by mouth daily. 07/17/20   [provider]  BD PEN NEEDLE NANO U/F 32G X 4 MM MISC USE ONE PEN NEEDLE PER TRESIBA INJECTION DAILY 04/24/19   [provider]  latanoprost (XALATAN) 0.005 % ophthalmic solution 1 drop at bedtime. 07/08/19   [provider]  metFORMIN (GLUCOPHAGE) 1000 MG tablet Take  1,000 mg by mouth 2 (two) times daily. 02/22/16   [provider]  Multiple Vitamin (MULTIVITAMIN WITH MINERALS) TABS tablet Take 1 tablet by mouth daily.    [provider]  pioglitazone (ACTOS) 45 MG tablet Take 22.5 mg by mouth daily. 03/16/16   [provider]  rosuvastatin (CRESTOR) 10 MG tablet Take 10 mg by mouth daily. 06/04/19   [provider]  telmisartan (MICARDIS) 80 MG tablet Take 80 mg by mouth daily. 05/08/19   [provider]  TRESIBA FLEXTOUCH 100 UNIT/ML SOPN FlexTouch Pen Inject 16 Units into the skin daily.  03/06/19   [provider]    Family History Family History  Problem Relation Age of Onset   Colon polyps Mother    Cervical cancer Mother    Lung cancer Brother        non smoker. served on nuclear carriers in the [employer].   Esophageal cancer Neg Hx    Rectal cancer Neg Hx    Stomach cancer Neg Hx    Breast cancer Neg Hx    Pancreatic cancer Neg Hx    Prostate cancer Neg Hx    Colon cancer Neg Hx     Social History Social History   Tobacco Use   Smoking status: Light Smoker    Types: Cigars   Smokeless tobacco:  Former    Quit date: 09/18/1997   Tobacco comments:    smokes cigars occasionally when playing golf  Vaping Use   Vaping Use: Never used  Substance Use Topics   Alcohol use: No    Alcohol/week: 0.0 standard drinks   Drug use: No     Allergies   Penicillins   Review of Systems Review of Systems No sore throat + cough No pleuritic pain No wheezing + nasal congestion + post-nasal drainage + Sinus pain/pressure No itchy/red eyes No earache No hemoptysis No SOB + fever, + chills No nausea No vomiting No abdominal pain No diarrhea No urinary symptoms No skin rash No fatigue + myalgias No headache Used OTC meds (Tylenol, Sinex) without relief   Physical Exam Triage Vital Signs ED Triage Vitals  Enc Vitals Group     BP 07/29/21 1556 115/65     Pulse Rate 07/29/21 1556 66      Resp 07/29/21 1556 18     Temp 07/29/21 1556 98.2 F (36.8 C)     Temp Source 07/29/21 1556 Oral     SpO2 07/29/21 1556 97 %     Weight 07/29/21 1557 166 lb (75.3 kg)     Height 07/29/21 1557 5\' 9"  (1.753 m)     Head Circumference --      Peak Flow --      Pain Score 07/29/21 1556 0     Pain Loc --      Pain Edu? --      Excl. in [place]? --    No data found.  Updated Vital Signs BP 115/65 (BP Location: Left Arm)   Pulse 66   Temp 98.2 F (36.8 C) (Oral)   Resp 18   Ht 5\' 9"  (1.753 m)   Wt 75.3 kg   SpO2 97%   BMI 24.51 kg/m   Visual Acuity Right Eye Distance:   Left Eye Distance:   Bilateral Distance:    Right Eye Near:   Left Eye Near:    Bilateral Near:     Physical Exam Nursing notes and Vital Signs reviewed. Appearance:  Patient appears stated age, and in no acute distress Eyes:  Pupils are equal, round, and reactive to light and accomodation.  Extraocular movement is intact.  Conjunctivae are not inflamed  Ears:  Canals normal.  Tympanic membranes normal.  Nose:  Congested turbinates.   Maxillary sinus tenderness is present.  Pharynx:  Normal Neck:  Supple.  No adenopathy. Lungs:  Clear to auscultation.  Breath sounds are equal.  Moving air well. Heart:  Regular rate and rhythm without murmurs, rubs, or gallops.  Abdomen:  Nontender without masses or hepatosplenomegaly.  Bowel sounds are present.  No CVA or flank tenderness.  Extremities:  No edema.  Skin:  No rash present.   UC Treatments / Results  Labs (all labs ordered are listed, but only abnormal results are displayed) Labs Reviewed - No data to display  EKG   Radiology No results found.  Procedures Procedures (including critical care time)  Medications Ordered in UC Medications - No data to display  Initial Impression / Assessment and Plan / UC Course  I have reviewed the triage vital signs and the nursing notes.  Pertinent labs & imaging results that were available during my care of  the patient were reviewed by me and considered in my medical decision making (see chart for details).    Begin Augmentin. Followup with Family Doctor if not improved in  one week.   Final Clinical Impressions(s) / UC Diagnoses   Final diagnoses:  Acute maxillary sinusitis, recurrence not specified     Discharge Instructions      Take plain guaifenesin (1200mg  extended release tabs such as Mucinex) twice daily, with plenty of water, for cough and congestion.  Get adequate rest.   May use Afrin nasal spray (or generic oxymetazoline) each morning for about 5 days and then discontinue.  Also recommend using saline nasal spray several times daily and saline nasal irrigation (AYR is a common brand).  Use Flonase nasal spray each morning after using Afrin nasal spray and saline nasal irrigation.       ED Prescriptions     Medication Sig Dispense Auth. Provider   amoxicillin-clavulanate (AUGMENTIN) 875-125 MG tablet Take 1 tablet by mouth every 12 (twelve) hours for 10 days. Take with food. 20 tablet Kandra Nicolas, MD         Kandra Nicolas, MD 07/31/21 [phone]

## 2021-07-29 NOTE — Discharge Instructions (Signed)
Take plain guaifenesin (1200mg  extended release tabs such as Mucinex) twice daily, with plenty of water, for cough and congestion.  Get adequate rest.   May use Afrin nasal spray (or generic oxymetazoline) each morning for about 5 days and then discontinue.  Also recommend using saline nasal spray several times daily and saline nasal irrigation (AYR is a common brand).  Use Flonase nasal spray each morning after using Afrin nasal spray and saline nasal irrigation.

## 2021-08-15 ENCOUNTER — Telehealth: Payer: Self-pay | Admitting: *Deleted

## 2021-08-15 NOTE — Telephone Encounter (Signed)
Called patient to remind of lab appt. on 08-18-21, lvm for a return call

## 2021-08-16 ENCOUNTER — Other Ambulatory Visit: Payer: Self-pay

## 2021-08-16 ENCOUNTER — Encounter (HOSPITAL_BASED_OUTPATIENT_CLINIC_OR_DEPARTMENT_OTHER): Payer: Self-pay | Admitting: Urology

## 2021-08-16 DIAGNOSIS — Z01818 Encounter for other preprocedural examination: Secondary | ICD-10-CM | POA: Diagnosis not present

## 2021-08-16 DIAGNOSIS — C61 Malignant neoplasm of prostate: Secondary | ICD-10-CM | POA: Diagnosis not present

## 2021-08-16 NOTE — Progress Notes (Signed)
Spoke w/ via phone for pre-op interview--- pt Lab needs dos----   no            Lab results------ pt getting lab work lab done 08-18-2021 CBC/ CMP/ PT/ PTT;  current ekg / cxr in epic/ chart COVID test -----patient states asymptomatic no test needed Arrive at ------- 1100 on 08-22-2021 NPO after MN NO Solid Food.  Clear liquids from MN until--- 1000 Med rec completed Medications to take morning of surgery ----- crestor, norvasc Diabetic medication ----- do not take actos/ metformin morning of surgery.  Do half dose of tresiba night before surgery  Patient instructed no nail polish to be worn day of surgery Patient instructed to bring photo id and insurance card day of surgery Patient aware to have Driver (ride ) / caregiver for 24 hours after surgery --wife, [facility] Patient Special Instructions ----- will do one fleet enema morning of surgery Pre-Op special Istructions ----- n/a Patient verbalized understanding of instructions that were given at this phone interview. Patient denies shortness of breath, chest pain, fever, cough at this phone interview.

## 2021-08-18 ENCOUNTER — Encounter (HOSPITAL_COMMUNITY)
Admission: RE | Admit: 2021-08-18 | Discharge: 2021-08-18 | Disposition: A | Discharge status: Home / Self Care | Payer: Medicare HMO | Source: Ambulatory Visit | Attending: Urology | Admitting: Urology

## 2021-08-18 ENCOUNTER — Other Ambulatory Visit: Payer: Self-pay

## 2021-08-18 DIAGNOSIS — Z01812 Encounter for preprocedural laboratory examination: Secondary | ICD-10-CM | POA: Insufficient documentation

## 2021-08-18 LAB — CBC
HCT: 44.5 % (ref 39.0–52.0)
Hemoglobin: 14.4 g/dL (ref 13.0–17.0)
MCH: 32.2 pg (ref 26.0–34.0)
MCHC: 32.4 g/dL (ref 30.0–36.0)
MCV: 99.6 fL (ref 80.0–100.0)
Platelets: 230 10*3/uL (ref 150–400)
RBC: 4.47 MIL/uL (ref 4.22–5.81)
RDW: 12.2 % (ref 11.5–15.5)
WBC: 8.1 10*3/uL (ref 4.0–10.5)
nRBC: 0 % (ref 0.0–0.2)

## 2021-08-18 LAB — COMPREHENSIVE METABOLIC PANEL
ALT: 24 U/L (ref 0–44)
AST: 20 U/L (ref 15–41)
Albumin: 3.9 g/dL (ref 3.5–5.0)
Alkaline Phosphatase: 74 U/L (ref 38–126)
Anion gap: 6 (ref 5–15)
BUN: 22 mg/dL (ref 8–23)
CO2: 23 mmol/L (ref 22–32)
Calcium: 9.2 mg/dL (ref 8.9–10.3)
Chloride: 104 mmol/L (ref 98–111)
Creatinine, Ser: 0.93 mg/dL (ref 0.61–1.24)
GFR, Estimated: >60 mL/min (ref >60)
Glucose, Bld: 210 mg/dL — ABNORMAL HIGH (ref 70–99)
Potassium: 4.9 mmol/L (ref 3.5–5.1)
Sodium: 133 mmol/L — ABNORMAL LOW (ref 135–145)
Total Bilirubin: 0.6 mg/dL (ref 0.3–1.2)
Total Protein: 6.5 g/dL (ref 6.5–8.1)

## 2021-08-18 LAB — APTT: aPTT: 28 seconds (ref 24–36)

## 2021-08-18 LAB — PROTIME-INR
INR: 0.9 (ref 0.8–1.2)
Prothrombin Time: 12.4 seconds (ref 11.4–15.2)

## 2021-08-19 ENCOUNTER — Telehealth: Payer: Self-pay | Admitting: *Deleted

## 2021-08-19 NOTE — Telephone Encounter (Signed)
CALLED PATIENT TO REMIND OF PROCEDURE FOR 08-22-21, SPOKE WITH PATIENT AND HE IS AWARE OF THIS PROCEDURE

## 2021-08-22 ENCOUNTER — Ambulatory Visit (HOSPITAL_BASED_OUTPATIENT_CLINIC_OR_DEPARTMENT_OTHER): Payer: Medicare HMO | Admitting: Anesthesiology

## 2021-08-22 ENCOUNTER — Ambulatory Visit (HOSPITAL_BASED_OUTPATIENT_CLINIC_OR_DEPARTMENT_OTHER)
Admission: RE | Admit: 2021-08-22 | Discharge: 2021-08-22 | Disposition: A | Discharge status: Home / Self Care | Payer: Medicare HMO | Attending: Urology | Admitting: Urology

## 2021-08-22 ENCOUNTER — Other Ambulatory Visit: Payer: Self-pay

## 2021-08-22 ENCOUNTER — Encounter (HOSPITAL_BASED_OUTPATIENT_CLINIC_OR_DEPARTMENT_OTHER): Payer: Self-pay | Admitting: Urology

## 2021-08-22 ENCOUNTER — Encounter (HOSPITAL_BASED_OUTPATIENT_CLINIC_OR_DEPARTMENT_OTHER)
Admission: RE | Disposition: A | Discharge status: Home / Self Care | Payer: Self-pay | Source: Home / Self Care | Attending: Urology

## 2021-08-22 ENCOUNTER — Ambulatory Visit (HOSPITAL_COMMUNITY): Payer: Medicare HMO

## 2021-08-22 DIAGNOSIS — E119 Type 2 diabetes mellitus without complications: Secondary | ICD-10-CM | POA: Diagnosis not present

## 2021-08-22 DIAGNOSIS — C61 Malignant neoplasm of prostate: Secondary | ICD-10-CM | POA: Diagnosis not present

## 2021-08-22 DIAGNOSIS — R69 Illness, unspecified: Secondary | ICD-10-CM | POA: Diagnosis not present

## 2021-08-22 DIAGNOSIS — E785 Hyperlipidemia, unspecified: Secondary | ICD-10-CM | POA: Diagnosis not present

## 2021-08-22 DIAGNOSIS — F17200 Nicotine dependence, unspecified, uncomplicated: Secondary | ICD-10-CM | POA: Diagnosis not present

## 2021-08-22 DIAGNOSIS — N401 Enlarged prostate with lower urinary tract symptoms: Secondary | ICD-10-CM | POA: Insufficient documentation

## 2021-08-22 DIAGNOSIS — I10 Essential (primary) hypertension: Secondary | ICD-10-CM | POA: Diagnosis not present

## 2021-08-22 HISTORY — PX: RADIOACTIVE SEED IMPLANT: SHX5150

## 2021-08-22 HISTORY — PX: CYSTOSCOPY: SHX5120

## 2021-08-22 HISTORY — DX: Presence of spectacles and contact lenses: Z97.3

## 2021-08-22 HISTORY — DX: Personal history of other (healed) physical injury and trauma: Z87.828

## 2021-08-22 HISTORY — DX: Benign prostatic hyperplasia with lower urinary tract symptoms: N40.1

## 2021-08-22 HISTORY — DX: Unspecified right bundle-branch block: I45.10

## 2021-08-22 HISTORY — DX: Personal history of adenomatous and serrated colon polyps: Z86.0101

## 2021-08-22 HISTORY — DX: Personal history of colonic polyps: Z86.010

## 2021-08-22 HISTORY — DX: Type 2 diabetes mellitus without complications: E11.9

## 2021-08-22 HISTORY — PX: SPACE OAR INSTILLATION: SHX6769

## 2021-08-22 LAB — GLUCOSE, CAPILLARY
Glucose-Capillary: 107 mg/dL — ABNORMAL HIGH (ref 70–99)
Glucose-Capillary: 103 mg/dL — ABNORMAL HIGH (ref 70–99)

## 2021-08-22 SURGERY — INSERTION, RADIATION SOURCE, PROSTATE
Anesthesia: General | Site: Prostate

## 2021-08-22 MED ORDER — CIPROFLOXACIN IN D5W 400 MG/200ML IV SOLN
400.0000 mg | INTRAVENOUS | Status: AC
  Administered 2021-08-22: 400 mg via INTRAVENOUS

## 2021-08-22 MED ORDER — SUGAMMADEX SODIUM 200 MG/2ML IV SOLN
INTRAVENOUS | Status: DC | PRN
  Administered 2021-08-22: 160 mg via INTRAVENOUS

## 2021-08-22 MED ORDER — ROCURONIUM BROMIDE 100 MG/10ML IV SOLN
INTRAVENOUS | Status: DC | PRN
  Administered 2021-08-22: 50 mg via INTRAVENOUS
  Administered 2021-08-22 (×2): 10 mg via INTRAVENOUS

## 2021-08-22 MED ORDER — LIDOCAINE 2% (20 MG/ML) 5 ML SYRINGE
INTRAMUSCULAR | Status: AC
  Filled 2021-08-22: qty 20

## 2021-08-22 MED ORDER — LIDOCAINE HCL (CARDIAC) PF 100 MG/5ML IV SOSY
PREFILLED_SYRINGE | INTRAVENOUS | Status: DC | PRN
  Administered 2021-08-22: 50 mg via INTRAVENOUS

## 2021-08-22 MED ORDER — ACETAMINOPHEN 10 MG/ML IV SOLN
INTRAVENOUS | Status: DC | PRN
  Administered 2021-08-22: 1000 mg via INTRAVENOUS

## 2021-08-22 MED ORDER — OXYCODONE HCL 5 MG PO TABS: 5.0000 mg | ORAL_TABLET | Freq: Once | ORAL | Status: DC | PRN

## 2021-08-22 MED ORDER — FENTANYL CITRATE (PF) 100 MCG/2ML IJ SOLN: 25.0000 ug | INTRAMUSCULAR | Status: DC | PRN

## 2021-08-22 MED ORDER — ONDANSETRON HCL 4 MG/2ML IJ SOLN: 4.0000 mg | Freq: Once | INTRAMUSCULAR | Status: DC | PRN

## 2021-08-22 MED ORDER — SUGAMMADEX SODIUM 200 MG/2ML IV SOLN: INTRAVENOUS | Status: DC | PRN

## 2021-08-22 MED ORDER — SODIUM CHLORIDE (PF) 0.9 % IJ SOLN
INTRAMUSCULAR | Status: DC | PRN
  Administered 2021-08-22: 10 mL

## 2021-08-22 MED ORDER — OXYCODONE-ACETAMINOPHEN 5-325 MG PO TABS: 1.0000 | ORAL_TABLET | ORAL | 0 refills | Status: DC | PRN

## 2021-08-22 MED ORDER — GLYCOPYRROLATE PF 0.2 MG/ML IJ SOSY
PREFILLED_SYRINGE | INTRAMUSCULAR | Status: AC
  Filled 2021-08-22: qty 2

## 2021-08-22 MED ORDER — MEPERIDINE HCL 25 MG/ML IJ SOLN: 6.2500 mg | INTRAMUSCULAR | Status: DC | PRN

## 2021-08-22 MED ORDER — FENTANYL CITRATE (PF) 100 MCG/2ML IJ SOLN
INTRAMUSCULAR | Status: DC | PRN
  Administered 2021-08-22: 50 ug via INTRAVENOUS
  Administered 2021-08-22: 25 ug via INTRAVENOUS

## 2021-08-22 MED ORDER — ROCURONIUM BROMIDE 10 MG/ML (PF) SYRINGE
PREFILLED_SYRINGE | INTRAVENOUS | Status: AC
  Filled 2021-08-22: qty 10

## 2021-08-22 MED ORDER — OXYCODONE HCL 5 MG/5ML PO SOLN: 5.0000 mg | Freq: Once | ORAL | Status: DC | PRN

## 2021-08-22 MED ORDER — DEXAMETHASONE SODIUM PHOSPHATE 10 MG/ML IJ SOLN
INTRAMUSCULAR | Status: AC
  Filled 2021-08-22: qty 1

## 2021-08-22 MED ORDER — ACETAMINOPHEN 160 MG/5ML PO SOLN: 325.0000 mg | ORAL | Status: DC | PRN

## 2021-08-22 MED ORDER — GLYCOPYRROLATE 0.2 MG/ML IJ SOLN
INTRAMUSCULAR | Status: DC | PRN
  Administered 2021-08-22 (×2): .1 mg via INTRAVENOUS

## 2021-08-22 MED ORDER — FLEET ENEMA 7-19 GM/118ML RE ENEM: 1.0000 | ENEMA | Freq: Once | RECTAL | Status: DC

## 2021-08-22 MED ORDER — ONDANSETRON HCL 4 MG/2ML IJ SOLN
INTRAMUSCULAR | Status: AC
  Filled 2021-08-22: qty 8

## 2021-08-22 MED ORDER — ONDANSETRON HCL 4 MG/2ML IJ SOLN
INTRAMUSCULAR | Status: DC | PRN
  Administered 2021-08-22: 4 mg via INTRAVENOUS

## 2021-08-22 MED ORDER — PROPOFOL 10 MG/ML IV BOLUS
INTRAVENOUS | Status: DC | PRN
  Administered 2021-08-22: 130 mg via INTRAVENOUS
  Administered 2021-08-22: 50 mg via INTRAVENOUS

## 2021-08-22 MED ORDER — DOCUSATE SODIUM 100 MG PO CAPS: 100.0000 mg | ORAL_CAPSULE | Freq: Every day | ORAL | 0 refills | Status: DC | PRN

## 2021-08-22 MED ORDER — SODIUM CHLORIDE 0.9 % IR SOLN
Status: DC | PRN
  Administered 2021-08-22: 1000 mL via INTRAVESICAL

## 2021-08-22 MED ORDER — ACETAMINOPHEN 325 MG PO TABS: 325.0000 mg | ORAL_TABLET | ORAL | Status: DC | PRN

## 2021-08-22 MED ORDER — FENTANYL CITRATE (PF) 100 MCG/2ML IJ SOLN
INTRAMUSCULAR | Status: AC
  Filled 2021-08-22: qty 2

## 2021-08-22 MED ORDER — ACETAMINOPHEN 10 MG/ML IV SOLN
INTRAVENOUS | Status: AC
  Filled 2021-08-22: qty 100

## 2021-08-22 MED ORDER — LACTATED RINGERS IV SOLN: INTRAVENOUS | Status: DC

## 2021-08-22 MED ORDER — PHENYLEPHRINE HCL (PRESSORS) 10 MG/ML IV SOLN
INTRAVENOUS | Status: DC | PRN
  Administered 2021-08-22 (×3): 80 ug via INTRAVENOUS

## 2021-08-22 MED ORDER — CIPROFLOXACIN IN D5W 400 MG/200ML IV SOLN
INTRAVENOUS | Status: AC
  Filled 2021-08-22: qty 200

## 2021-08-22 MED ORDER — IOHEXOL 300 MG/ML  SOLN
INTRAMUSCULAR | Status: DC | PRN
  Administered 2021-08-22: 7 mL

## 2021-08-22 SURGICAL SUPPLY — 43 items
BAG DRN RND TRDRP ANRFLXCHMBR (UROLOGICAL SUPPLIES) ×3
BAG URINE DRAIN 2000ML AR STRL (UROLOGICAL SUPPLIES) ×4 IMPLANT
BLADE CLIPPER SENSICLIP SURGIC (BLADE) ×4 IMPLANT
CATH FOLEY 2WAY SLVR  5CC 16FR (CATHETERS) ×4
CATH FOLEY 2WAY SLVR 5CC 16FR (CATHETERS) ×3 IMPLANT
CATH ROBINSON RED A/P 16FR (CATHETERS) IMPLANT
CATH ROBINSON RED A/P 20FR (CATHETERS) ×4 IMPLANT
CLOTH BEACON ORANGE TIMEOUT ST (SAFETY) ×4 IMPLANT
COVER BACK TABLE 60X90IN (DRAPES) ×4 IMPLANT
COVER MAYO STAND STRL (DRAPES) ×4 IMPLANT
DRSG TEGADERM 4X4.75 (GAUZE/BANDAGES/DRESSINGS) ×4 IMPLANT
DRSG TEGADERM 8X12 (GAUZE/BANDAGES/DRESSINGS) ×4 IMPLANT
GAUZE SPONGE 4X4 12PLY STRL LF (GAUZE/BANDAGES/DRESSINGS) ×1 IMPLANT
GEL ULTRASOUND 20GR AQUASONIC (MISCELLANEOUS) ×4 IMPLANT
GLOVE SURG ENC MOIS LTX SZ6 (GLOVE) IMPLANT
GLOVE SURG ENC MOIS LTX SZ6.5 (GLOVE) ×1 IMPLANT
GLOVE SURG ENC MOIS LTX SZ7 (GLOVE) ×4 IMPLANT
GLOVE SURG ENC MOIS LTX SZ8 (GLOVE) IMPLANT
GLOVE SURG ORTHO LTX SZ8.5 (GLOVE) IMPLANT
GLOVE SURG UNDER POLY LF SZ6.5 (GLOVE) IMPLANT
GOWN STRL REUS W/TWL LRG LVL3 (GOWN DISPOSABLE) ×4 IMPLANT
GRID BRACH TEMP 18GA 2.8X3X.75 (MISCELLANEOUS) ×4 IMPLANT
HOLDER FOLEY CATH W/STRAP (MISCELLANEOUS) IMPLANT
IMPL SPACEOAR VUE SYSTEM (Spacer) IMPLANT
IMPLANT SPACEOAR VUE SYSTEM (Spacer) ×4 IMPLANT
IV NS 1000ML (IV SOLUTION) ×4
IV NS 1000ML BAXH (IV SOLUTION) ×3 IMPLANT
KIT TURNOVER CYSTO (KITS) ×4 IMPLANT
NDL BRACHY 18G 5PK (NEEDLE) ×12 IMPLANT
NDL BRACHY 18G SINGLE (NEEDLE) IMPLANT
NDL PK MORGANSTERN STABILIZ (NEEDLE) ×3 IMPLANT
NEEDLE BRACHY 18G 5PK (NEEDLE) ×16 IMPLANT
NEEDLE BRACHY 18G SINGLE (NEEDLE) IMPLANT
NEEDLE PK MORGANSTERN STABILIZ (NEEDLE) ×4 IMPLANT
PACK CYSTO (CUSTOM PROCEDURE TRAY) ×4 IMPLANT
QUICKLINK CARTRIDGES AND BRACHY SOURCE ×66 IMPLANT
SHEATH ULTRASOUND LF (SHEATH) IMPLANT
SHEATH ULTRASOUND LTX NONSTRL (SHEATH) ×1 IMPLANT
SUT BONE WAX W31G (SUTURE) IMPLANT
SYR 10ML LL (SYRINGE) ×4 IMPLANT
TOWEL OR 17X26 10 PK STRL BLUE (TOWEL DISPOSABLE) ×4 IMPLANT
UNDERPAD 30X36 HEAVY ABSORB (UNDERPADS AND DIAPERS) ×8 IMPLANT
WATER STERILE IRR 500ML POUR (IV SOLUTION) ×4 IMPLANT

## 2021-08-22 NOTE — H&P (Signed)
Office Visit Report     08/16/2021   --------------------------------------------------------------------------------   Francisco Fleming  MRN: [id number]  DOB: 1951-05-12, 69 year old Male  SSN:    PRIMARY CARE:  Montez Morita, MD  REFERRING:  Janith Lima, MD  PROVIDER:  Rexene Alberts, M.D.  TREATING:  Jiles Crocker, NP  LOCATION:  [facility] [phone]     --------------------------------------------------------------------------------   CC/HPI: Francisco Fleming is a 70 year old male with localized prostate cancer on active surveillance.   Patient underwent prostate biopsy on 07/23/2020 for an elevated PSA of 4.7 ng/mL on 04/21/2020. Biopsy revealed GS 3+3 = 6, adenocarcinoma of the prostate with 4 cores positive (5-50%), TRUS volume of 45.25 cm3. PSA on 01/24/2021 was 4.87. Repeat PSA on 05/27/2019 was 3.33. His prostate MRI on 05/01/2021 demonstrated an anterior left mid to apical central core nodule versus a PI-RADS 4 lesion. MRI fusion biopsy 05/26/2021 demonstrated GS 3+4 = 7 at the ROI in 3/4 cores and GS 3+3 = 6 and the standard 3/12 core. He denies a family history of prostate cancer. Denies new or worsening bone or back pain. Good appetite and stable weight.   AUA SS: 5, QOL 0  SHIM: 24: No problems with erections  ECOG: 0   PCa Dx Date: 07/23/2020  PSA Trend: 4.29 on 11/19/2019; 4.7 on 04/21/2020; 4.87 on 01/24/2021; 3.33 on 05/26/2021   MSK nomogram:  15-year prostate cancer specific survival: 99%  Progression free probability (5-year, 10-year): 91%, 85%  Organ confined disease: 72%  Extracapsular extension: 27%  Lymph node involvement: 2%  Seminal vesicle invasion: 1%   He has past medical history seen for diabetes. He denies cardiac or pulmonary history. Denies taking anticoagulation. He has no prior abdominal surgical history.   08/16/2021: Here today for preoperative appointment prior to undergoing brachytherapy seed placement with SpaceOAR on 12/5 with his urologist.  He denies any changes to baseline lower urinary tract symptoms. He has had no appreciable change in force of stream, interval dysuria or gross hematuria. Daytime frequency and nocturia remained stable and consistent with his baseline. In early October he contracted COVID as well as a sinus infection but has been gradually improving since then. He last had antibiotics approximately 2 weeks ago in the form of Augmentin. Currently denying any chest pain, shortness of breath. He had a mild low-grade fever in early October correlated with the COVID diagnosis but that has resolved. He is having normal bowel movements without constipation or diarrhea. Denies other changes to past medical history, no new prescription medications taken on a daily basis, no interval surgical or procedural intervention.     ALLERGIES: Penicillin    MEDICATIONS: Metformin Hcl  Amlodipine Besylate 10 mg tablet  Pioglitazone Hcl  Rosuvastatin Calcium  Telmisartan 80 mg tablet  Tresiba     GU PSH: Prostate Needle Biopsy - 05/26/2021, 07/23/2020     NON-GU PSH: Back surgery Cataract surgery, Right Rotator Cuff Surgery.., Left Sinus surgery Surgical Pathology, Gross And Microscopic Examination For Prostate Needle - 05/26/2021, 07/23/2020     GU PMH: BPH w/o LUTS - 06/07/2021, - 05/26/2021, - 06/15/2020 Incomplete bladder emptying - 06/07/2021, - 01/31/2021, - 08/03/2020 Prostate Cancer - 06/07/2021, - 05/26/2021, - 01/31/2021, - 08/03/2020 Nocturia - 01/31/2021, - 08/03/2020 Urinary Frequency - 08/03/2020 Elevated PSA - 07/23/2020, - 06/15/2020 Encounter for Prostate Cancer screening - 06/15/2020 Ureteral calculus - 06/15/2020 Renal calculus    NON-GU PMH: Diabetes Type 2 Hypercholesterolemia Hypertension    FAMILY HISTORY: 1  Daughter - Other 3 Son's - Other Cancer - Runs in Family Diabetes - Runs in Family heart failure - Father nephrolithiasis - Mother   SOCIAL HISTORY: Marital Status: Married Preferred Language:  English; Race: White Current Smoking Status: Patient does not smoke anymore. Has not smoked since 05/19/2018.   Tobacco Use Assessment Completed: Used Tobacco in last 30 days? Drinks 3 caffeinated drinks per day.    REVIEW OF SYSTEMS:    GU Review Male:   Patient reports get up at night to urinate. Patient denies frequent urination, hard to postpone urination, burning/ pain with urination, leakage of urine, stream starts and stops, trouble starting your stream, have to strain to urinate , erection problems, and penile pain.  Gastrointestinal (Upper):   Patient denies nausea, vomiting, and indigestion/ heartburn.  Gastrointestinal (Lower):   Patient denies diarrhea and constipation.  Constitutional:   Patient denies fever, night sweats, weight loss, and fatigue.  Skin:   Patient denies skin rash/ lesion and itching.  Eyes:   Patient denies blurred vision and double vision.  Ears/ Nose/ Throat:   Patient reports sinus problems. Patient denies sore throat.  Hematologic/Lymphatic:   Patient denies swollen glands and easy bruising.  Cardiovascular:   Patient denies leg swelling and chest pains.  Respiratory:   Patient reports cough. Patient denies shortness of breath.  Endocrine:   Patient denies excessive thirst.  Musculoskeletal:   Patient denies back pain and joint pain.  Neurological:   Patient denies headaches and dizziness.  Psychologic:   Patient denies depression and anxiety.   Notes: Updated from previous visit 06/15/2020 with review from patient as noted above.   VITAL SIGNS:      08/16/2021 10:38 AM  Weight 166 lb / 75.3 kg  Height 69 in / 175.26 cm  BP 168/71 mmHg  Pulse 71 /min  Temperature 97.0 F / 36.1 C  BMI 24.5 kg/m   MULTI-SYSTEM PHYSICAL EXAMINATION:    Constitutional: Well-nourished. No physical deformities. Normally developed. Good grooming.  Neck: Neck symmetrical, not swollen. Normal tracheal position.  Respiratory: No labored breathing, no use of accessory  muscles.   Cardiovascular: Normal temperature, normal extremity pulses, no swelling, no varicosities.  Skin: No paleness, no jaundice, no cyanosis. No lesion, no ulcer, no rash.  Neurologic / Psychiatric: Oriented to time, oriented to place, oriented to person. No depression, no anxiety, no agitation.  Gastrointestinal: No mass, no tenderness, no rigidity, non obese abdomen.  Musculoskeletal: Normal gait and station of head and neck.     Complexity of Data:  Source Of History:  Patient, Medical Record Summary  Lab Test Review:   PSA  Records Review:   Pathology Reports, Previous Doctor Records, Previous Hospital Records, Previous Patient Records  Urine Test Review:   Urinalysis   05/26/21 01/24/21 11/03/20  PSA  Total PSA 3.33 ng/mL 4.87 ng/mL 4.33 ng/mL    08/16/21  Urinalysis  Urine Appearance Clear   Urine Color Yellow   Urine Glucose 2+ mg/dL  Urine Bilirubin Neg mg/dL  Urine Ketones Neg mg/dL  Urine Specific Gravity 1.025   Urine Blood Neg ery/uL  Urine pH <=5.0   Urine Protein Neg mg/dL  Urine Urobilinogen 0.2 mg/dL  Urine Nitrites Neg   Urine Leukocyte Esterase Neg leu/uL   PROCEDURES:          Urinalysis Dipstick Dipstick Cont'd  Color: Yellow Bilirubin: Neg mg/dL  Appearance: Clear Ketones: Neg mg/dL  Specific Gravity: 1.025 Blood: Neg ery/uL  pH: <=5.0 Protein: Neg mg/dL  Glucose: 2+ mg/dL Urobilinogen: 0.2 mg/dL    Nitrites: Neg    Leukocyte Esterase: Neg leu/uL    ASSESSMENT:      ICD-10 Details  1 GU:   Prostate Cancer - C61 Chronic, Threat to Bodily Function  2 NON-GU:   Encounter for other preprocedural examination - Z01.818 Undiagnosed New Problem   PLAN:            Medications Stop Meds: Levofloxacin 750 mg tablet Take 1 tablet PO the morning of your procedure  Start: 06/15/2020  Discontinue: 08/16/2021  - Reason: The medication cycle was completed.            Orders Labs Urine Culture          Schedule Return Visit/Planned Activity:  Keep Scheduled Appointment - Schedule Surgery, Follow up MD          Document Letter(s):  Created for Patient: Clinical Summary         Notes:   All questions answered to the best of my ability regarding upcoming procedure and expected postoperative course with understanding expressed by the patient. Precautionary urine culture sent today to serve as baseline. He will proceed with previously scheduled radioactive seed implant with SpaceOAR insertion on 12/5.        Next Appointment:      Next Appointment: 08/22/2021 01:00 PM    Appointment Type: Surgery     Location: [facility] [phone]    Provider: Rexene Alberts, M.D.    Reason for Visit: NE/OP RAD [place]      Urology Preoperative H&P   Chief Complaint: Prostate cancer  History of Present Illness: Francisco Fleming is a 70 y.o. male with prostate cancer here for brachytherapy and Space OAR. Denies fevers or chills.    Past Medical History:  Diagnosis Date   History of adenomatous polyp of colon    History of COVID-19 06/18/2021   08-16-2021 pt stated had mild symptoms that resolved but then had sinus infection and the flu with residual dry cough   History of eye injury    per pt right eye trauma from hit with baseball which caused increased eye pressure , uses eye drop   History of kidney stones 2021   Hyperplasia of prostate with lower urinary tract symptoms (LUTS)    Hypertension    Incomplete right bundle branch block (RBBB)    Prostate cancer ([place]) 07/2020   urologist--- dr Worthington Cruzan;  dx 11/ 2021 Gleason 3+3 active survillence;  biopsy 09/ 2022 Gleason 3+4   Type 2 diabetes mellitus ([place])    followed  by pcp  (08-16-2021 pt stated check blood sugar once weekly fasting but did not know average)   Wears glasses     Past Surgical History:  Procedure Laterality Date   CATARACT EXTRACTION W/ INTRAOCULAR LENS IMPLANT Right 2016   COLONOSCOPY  07/2019   by dr Henrene Pastor   LUMBAR LAMINECTOMY      1970s;    L3--L4   NASAL SINUS SURGERY     2002 approx   SHOULDER ARTHROSCOPY WITH ROTATOR CUFF REPAIR AND OPEN BICEPS TENODESIS Left 09/2018   TONSILLECTOMY     child    Allergies:  Allergies  Allergen Reactions   Penicillins Hives and Rash    Has patient had a PCN reaction causing immediate rash, facial/tongue/throat swelling, SOB or lightheadedness with hypotension:No Has patient had a PCN reaction causing severe rash involving mucus membranes or skin necrosis:No Has patient  had a PCN reaction that required hospitalization:No Has patient had a PCN reaction occurring within the last 10 years:No If all of the above answers are "NO", then may proceed with Cephalosporin use.     Family History  Problem Relation Age of Onset   Colon polyps Mother    Cervical cancer Mother    Lung cancer Brother        non smoker. served on nuclear carriers in the [employer].   Esophageal cancer Neg Hx    Rectal cancer Neg Hx    Stomach cancer Neg Hx    Breast cancer Neg Hx    Pancreatic cancer Neg Hx    Prostate cancer Neg Hx    Colon cancer Neg Hx     Social History:  reports that he has been smoking cigars. He quit smokeless tobacco use about 33 years ago.  His smokeless tobacco use included chew. He reports that he does not drink alcohol and does not use drugs.  ROS: A complete review of systems was performed.  All systems are negative except for pertinent findings as noted.  Physical Exam:  Vital signs in last 24 hours: Temp:  [97.6 F (36.4 C)] 97.6 F (36.4 C) (12/05 1123) Pulse Rate:  [61] 61 (12/05 1123) Resp:  [15] 15 (12/05 1123) BP: (146)/(70) 146/70 (12/05 1123) SpO2:  [100 %] 100 % (12/05 1123) Weight:  [77.3 kg] 77.3 kg (12/05 1123) Constitutional:  Alert and oriented, No acute distress Cardiovascular: Regular rate and rhythm Respiratory: Normal respiratory effort, Lungs clear bilaterally GI: Abdomen is soft, nontender, nondistended, no abdominal masses GU: No CVA  tenderness Lymphatic: No lymphadenopathy Neurologic: Grossly intact, no focal deficits Psychiatric: Normal mood and affect  Laboratory Data:  No results for input(s): WBC, HGB, HCT, PLT in the last 72 hours.  No results for input(s): NA, K, CL, GLUCOSE, BUN, CALCIUM, CREATININE in the last 72 hours.  Invalid input(s): CO3   Results for orders placed or performed during the hospital encounter of 08/22/21 (from the past 24 hour(s))  Glucose, capillary     Status: Abnormal   Collection Time: 08/22/21 11:26 AM  Result Value Ref Range   Glucose-Capillary 107 (H) 70 - 99 mg/dL   No results found for this or any previous visit (from the past 240 hour(s)).  Renal Function: Recent Labs    08/18/21 1005  CREATININE 0.93   Estimated Creatinine Clearance: 73.9 mL/min (by C-G formula based on SCr of 0.93 mg/dL).  Radiologic Imaging: No results found.  I independently reviewed the above imaging studies.  Assessment and Plan Francisco Fleming is a 70 y.o. male with prostate cancer here for brachytherapy and Space OAR.  Brachytherapy/space OAR consent- The patient was counseled about the natural history of prostate cancer and the standard treatment options that are available for prostate cancer. It was explained to him how his age and life expectancy, clinical stage, Gleason score, and PSA affect his prognosis, the decision to proceed with additional staging studies, as well as how that information influences recommended treatment strategies. We discussed the roles for active surveillance, radiation therapy, surgical therapy, androgen deprivation, as well as ablative therapy options for the treatment of prostate cancer as appropriate to his individual cancer situation. We discussed the risks and benefits of these options with regard to their impact on cancer control and also in terms of potential adverse events, complications, and impact on quality of life particularly related to urinary and sexual  function. The patient was encouraged  to ask questions throughout the discussion today and all questions were answered to his stated satisfaction. In addition, the patient was provided with and/or directed to appropriate resources and literature for further education about prostate cancer and treatment options.   The patient has decided to proceed with brachytherapy and SpaceOAR placement as primary treatment of his intermediate risk prostate cancer.  The risks, benefits and alternatives of the aforementioned procedures was discussed in detail.  Risks include, bur are not limited to worsening LUTS, erectile dysfunction, rectal irritation, urethral stricture formation, fistula formation, cancer recurrence, MI, CVA, PE, DVT and the inherent risk of general anesthesia.  He voices understanding and wishes to proceed.   Matt R. Margo Lama MD 08/22/2021, 11:48 AM  [facility] Pager: [phone]

## 2021-08-22 NOTE — Discharge Instructions (Addendum)
Activity:  You are encouraged to ambulate frequently (about every hour during waking hours) to help prevent blood clots from forming in your legs or lungs.    Diet: You should advance your diet as instructed by your physician.  It will be normal to have some bloating, nausea, and abdominal discomfort intermittently.  Prescriptions:  You will be provided a prescription for pain medication to take as needed.  If your pain is not severe enough to require the prescription pain medication, you may take extra strength Tylenol instead which will have less side effects.  You should also take a prescribed stool softener to avoid straining with bowel movements as the prescription pain medication may constipate you.  What to call us about: You should call the office [phone]) if you develop fever > 101 or develop persistent vomiting. Activity:  You are encouraged to ambulate frequently (about every hour during waking hours) to help prevent blood clots from forming in your legs or lungs.   Post Anesthesia Home Care Instructions  Activity: Get plenty of rest for the remainder of the day. A responsible adult should stay with you for 24 hours following the procedure.  For the next 24 hours, DO NOT: -Drive a car -Paediatric nurse -Drink alcoholic beverages -Take any medication unless instructed by your physician -Make any legal decisions or sign important papers.  Meals: Start with liquid foods such as gelatin or soup. Progress to regular foods as tolerated. Avoid greasy, spicy, heavy foods. If nausea and/or vomiting occur, drink only clear liquids until the nausea and/or vomiting subsides. Call your physician if vomiting continues.  Special Instructions/Symptoms: Your throat may feel dry or sore from the anesthesia or the breathing tube placed in your throat during surgery. If this causes discomfort, gargle with warm salt water. The discomfort should disappear within 24 hours.  If you had a  scopolamine patch placed behind your ear for the management of post- operative nausea and/or vomiting:  1. The medication in the patch is effective for 72 hours, after which it should be removed.  Wrap patch in a tissue and discard in the trash. Wash hands thoroughly with soap and water. 2. You may remove the patch earlier than 72 hours if you experience unpleasant side effects which may include dry mouth, dizziness or visual disturbances. 3. Avoid touching the patch. Wash your hands with soap and water after contact with the patch.   PROSTATE CANCER TREATMENT WITH RADIOACTIVE IODINE-125 SEED IMPLANT  This instruction sheet is intended to discuss implantation of Iodine-125 seeds as treatment for cancer of the prostate. It will explain in detail what you may expect from this treatment and what precautions are necessary as a result of the treatment. Iodine-125 emits a relatively low energy radiation. The radioactive seeds are surgically implanted directly into the prostate gland. Most of the radiation is contained within the prostate gland. A very small amount is present outside the body.The precautions that we ask you to take are to ensure that those around you are protected from unnecessary radiation. The principles of radiation safety that you need to understand are:  DISTANCE: The further a person is from the radioactive implant the less radiation they will be receiving. The amount of radiation received falls off quite rapidly with distance. More specific guidelines are given in the table on the last page.  TIME: The amount of radiation a person is exposed to is directly proportional to the amount of time that is spent in close proximity to the  radioactive implant. Very little radiation will be received during short periods. See the table on the last page for more specific guideline.  CHILDREN UNDER AGE 44 Children should not be allowed to sit on your lap or otherwise be in very close contact for  more than a few minutes for the first 6-8 weeks following the implant. You may affectionately greet (hug/kiss) a child for a short period of time, but remember, the longer you are in close proximity with that child the more radiation they are being exposed to. At a distance of 6 feet there is no limit to the length of time you may spend together. See specific guidelines on the last page.  PREGNANT OR POSSIBLY PREGNANT WOMEN Pregnant women should avoid prolonged close physical contact with you for the first 6-8 weeks after implant. At a distance of 6 feet there is no limit to the length of time you may spend together. Pregnant women or possibly pregnant women can safely be in close contact with you for a limited period of time. See the last page for guidelines.  FAMILY RELATIONS You may sleep in the same bed as your partner (provided she is not pregnant or under the age of 46). Sexual intercourse, using a condom, may be resumed 2 weeks after the implant. Your semen may be discolored, dark brown or black. This is normal and is the result of bleeding that may have occurred during the implant. After 3-4 weeks it will not be necessary to use a condom.  DAILY ACTIVITIES You may resume normal activities in a few days (example: work, shopping, church) without the risk of harmful radiation exposure to those around you provided you keep in mind the time and distance precautions. Objects that you touch or item that you use do not become radioactive. Linens, clothing, tableware, and dishes may be used by other persons without special precautions. Your bodily wastes (urine and stool) are not radioactive.  SPECIAL PRECAUTIONS It is possible to lose implanted Iodine-125 seed(s) through urination. Although it is possible to pass seeds indefinitely, it is most likely to occur immediately after catheter removal. To prevent this from happening the catheter that was in place during the implant procedure is removed  immediately after the implant and a cystoscopy procedure is performed. The process of removing the catheter and the cystoscopy procedure should dislodge and remove any seeds that are not firmly imbedded in the prostate tissue. However, you should watch for seeds if/when you remove your catheter at home. The seeds are silver colored and the size of a grain of rice. In the unlikely event that a seed is seen after urination, simply flush the seed down the toilet. The seed should not be handled with your fingers, not even with a glove or napkin. A spoon or tweezers can be used to pick up a seed. The Radiation Oncology department is open Monday - Friday from 8:00 am to 5:30 pm with a Radiation Oncologist on call at all times. He or she may be reached by calling [phone]. If you are to be hospitalized or if death should occur, your family should notify the Runner, broadcasting/film/video.  SIDE EFFECTS There are very few side effects associate with the implant procedure. Minor burning with urination, weak stream, hesitancy, intermittency, frequency, mild pain or feeling unable to pass your urine freely are common and usually stop in one to four months. If these symptoms are extremely uncomfortable, contact your physician.  RADIATION SAFETY GUIDELINES PROSTATE CANCER TREATMENT  WITH RADIOACTIVE IODINE-125 SEED IMPLANT  The following guidelines will limit exposure to less than naturally occurring background radiation.  PERSONS AGE 75-45 (if able to become pregnant)  FOR 8 WEEKS FOLLOWING IMPLANT  At a distance of 1 foot: limit time to less than 2 hours/week At a distance of 3 feet: limit time to 20 hours/week At a distance of 6 feet: no restrictions  AFTER 8 WEEKS No restrictions  CHILDREN UNDER AGE 75, PREGNANT WOMEN OR POSSIBLY PREGNANT WOMEN  FOR 8 WEEKS FOLLOWING IMPLANT At a distance of 1 foot: limit time to 10 minutes/week At a distance of 3 feet: limit time to 2 hours/week At a distance of 6  feet: no restrictions  AFTER 8 WEEKS No restrictions  PERSONS OVER THE AGE OF 45 AND DO NOT EXPECT TO HAVE ANY MORE CHILDREN No restrictions  Updated by [facility] in January 2020

## 2021-08-22 NOTE — Transfer of Care (Signed)
Immediate Anesthesia Transfer of Care Note  Patient: Francisco Fleming  Procedure(s) Performed: RADIOACTIVE SEED IMPLANT/BRACHYTHERAPY IMPLANT (Prostate) SPACE OAR INSTILLATION (Perineum) CYSTOSCOPY (Bladder)  Patient Location: PACU  Anesthesia Type:General  Level of Consciousness: awake, alert , oriented and patient cooperative  Airway & Oxygen Therapy: Patient Spontanous Breathing and Patient connected to nasal cannula oxygen  Post-op Assessment: Report given to RN and Post -op Vital signs reviewed and stable  Post vital signs: Reviewed and stable  Last Vitals:  Vitals Value Taken Time  BP 136/64 08/22/21 1500  Temp    Pulse 66 08/22/21 1500  Resp 22 08/22/21 1500  SpO2 99 % 08/22/21 1500  Vitals shown include unvalidated device data.  Last Pain:  Vitals:   08/22/21 1123  TempSrc: Oral  PainSc: 0-No pain      Patients Stated Pain Goal: 7 (28/24/17 5301)  Complications: No notable events documented.

## 2021-08-22 NOTE — Progress Notes (Signed)
  Radiation Oncology         [phone] ________________________________  Name: Francisco Fleming MRN: [id number]  Date: 08/22/2021  DOB: 1951-08-05       Prostate Seed Implant  WU:JWJX, Ravisankar, MD  No ref. provider found  DIAGNOSIS:  70 y.o. gentleman with Stage T1c adenocarcinoma of the prostate with Gleason score of 3+4, and PSA of 3.33.  Oncology History  Malignant neoplasm of prostate ([facility])  08/27/2020 Initial Diagnosis   Malignant neoplasm of prostate ([place])   05/26/2021 Cancer Staging   Staging form: Prostate, AJCC 8th Edition - Clinical stage from 05/26/2021: Stage IIB (cT1c, cN0, cM0, PSA: 3.3, Grade Group: 2) - Signed by Freeman Caldron, PA-C on 06/24/2021 Histopathologic type: Adenocarcinoma, NOS Stage prefix: Initial diagnosis Prostate specific antigen (PSA) range: Less than 10 Gleason primary pattern: 3 Gleason secondary pattern: 4 Gleason score: 7 Histologic grading system: 5 grade system Number of biopsy cores examined: 16 Number of biopsy cores positive: 6 Location of positive needle core biopsies: Both sides      No diagnosis found.  PROCEDURE: Insertion of radioactive I-125 seeds into the prostate gland.  RADIATION DOSE: 145 Gy, definitive therapy.  TECHNIQUE: Francisco Fleming was brought to the operating room with the urologist. He was placed in the dorsolithotomy position. He was catheterized and a rectal tube was inserted. The perineum was shaved, prepped and draped. The ultrasound probe was then introduced into the rectum to see the prostate gland.  TREATMENT DEVICE: A needle grid was attached to the ultrasound probe stand and anchor needles were placed.  3D PLANNING: The prostate was imaged in 3D using a sagittal sweep of the prostate probe. These images were transferred to the planning computer. There, the prostate, urethra and rectum were defined on each axial reconstructed image. Then, the software created an optimized 3D plan and a few seed positions  were adjusted. The quality of the plan was reviewed using [facility] information for the target and the following two organs at risk:  Urethra and Rectum.  Then the accepted plan was printed and handed off to the radiation therapist.  Under my supervision, the custom loading of the seeds and spacers was carried out and loaded into sealed vicryl sleeves.  These pre-loaded needles were then placed into the needle holder.Marland Kitchen  PROSTATE VOLUME STUDY:  Using transrectal ultrasound the volume of the prostate was verified to be 47.1 cc.  SPECIAL TREATMENT PROCEDURE/SUPERVISION AND HANDLING: The pre-loaded needles were then delivered under sagittal guidance. A total of 18 needles were used to deposit 66 seeds in the prostate gland. The individual seed activity was 0.530 mCi.  SpaceOAR:  Yes, excellent placement  COMPLEX SIMULATION: At the end of the procedure, an anterior radiograph of the pelvis was obtained to document seed positioning and count. Cystoscopy was performed to check the urethra and bladder.  MICRODOSIMETRY: At the end of the procedure, the patient was emitting 0.137 mR/hr at 1 meter. Accordingly, he was considered safe for hospital discharge.  PLAN: The patient will return to the radiation oncology clinic for post implant CT dosimetry in three weeks.   ________________________________  Sheral Apley Tammi Klippel, M.D.

## 2021-08-22 NOTE — Op Note (Signed)
PATIENT:  Francisco Fleming  PRE-OPERATIVE DIAGNOSIS:  Adenocarcinoma of the prostate  POST-OPERATIVE DIAGNOSIS:  Same  PROCEDURE:  1. I-125 radioactive seed implantation 2. Cystoscopy  3. Placement of SpaceOAR  SURGEON:  Rexene Alberts, MD  Radiation oncologist: Tyler Pita, MD  Resident: Aldine Contes, PGY-4  ANESTHESIA:  General  EBL:  Minimal  DRAINS: None  INDICATION: Francisco Fleming  Description of procedure: After informed consent the patient was brought to the major OR, placed on the table and administered general anesthesia. He was then moved to the modified lithotomy position with his perineum perpendicular to the floor. His perineum and genitalia were then sterilely prepped. An official timeout was then performed. A 16 French Foley catheter was then placed in the bladder and filled with dilute contrast, a rectal tube was placed in the rectum and the transrectal ultrasound probe was placed in the rectum and affixed to the stand. He was then sterilely draped.  Real time ultrasonography was used along with the seed planning software. This was used to develop the seed plan including the number of needles as well as number of seeds required for complete and adequate coverage. Real-time ultrasonography was then used along with the previously developed plan and the Nucletron device to implant a total of 66 seeds using 18 needles. This proceeded without difficulty or complication.   I then proceeded with placement of SpaceOAR by introducing a needle with the bevel angled inferiorly approximately 2 cm superior to the anus. This was angled downward and under direct ultrasound was placed within the space between the prostatic capsule and rectum. This was confirmed with a small amount of sterile saline injected and this was performed under direct ultrasound. I then attached the SpaceOAR to the needle and injected this in the space between the prostate and rectum with good placement noted.  A  Foley catheter was then removed as well as the transrectal ultrasound probe and rectal probe. Flexible cystoscopy was then performed using the 16 French flexible scope which revealed a normal urethra throughout its length down to the sphincter which appeared intact. The prostatic urethra revealed bilobar hypertrophy but no evidence of obstruction, seeds, spacers or lesions. The bladder was then entered and fully and systematically inspected. The ureteral orifices were noted to be of normal configuration and position. The mucosa revealed no evidence of tumors. There were also no stones identified within the bladder. I noted no seeds or spacers on the floor of the bladder and retroflexion of the scope revealed no seeds protruding from the base of the prostate.  The cystoscope was then removed and the patient was awakened and taken to recovery room in stable and satisfactory condition. He tolerated procedure well and there were no intraoperative complications.  [facility]  Pager: [phone]

## 2021-08-22 NOTE — Anesthesia Procedure Notes (Signed)
Procedure Name: Intubation Date/Time: 08/22/2021 1:22 PM Performed by: Georgeanne Nim, CRNA Pre-anesthesia Checklist: Patient identified, Emergency Drugs available, Suction available, Patient being monitored and Timeout performed Patient Re-evaluated:Patient Re-evaluated prior to induction Oxygen Delivery Method: Circle system utilized Preoxygenation: Pre-oxygenation with 100% oxygen Induction Type: IV induction Ventilation: Mask ventilation without difficulty Laryngoscope Size: Mac and 4 Grade View: Grade I Tube type: Oral Tube size: 7.5 mm Number of attempts: 1 Placement Confirmation: ETT inserted through vocal cords under direct vision, positive ETCO2, CO2 detector and breath sounds checked- equal and bilateral Secured at: 23 cm Tube secured with: Tape Dental Injury: Teeth and Oropharynx as per pre-operative assessment

## 2021-08-22 NOTE — Anesthesia Preprocedure Evaluation (Addendum)
Anesthesia Evaluation  Patient identified by MRN, date of birth, ID band Patient awake    Reviewed: Allergy & Precautions, H&P , NPO status , Patient's Chart, lab work & pertinent test results, reviewed documented beta blocker date and time   Airway Mallampati: I  TM Distance: >3 FB Neck ROM: full    Dental no notable dental hx. (+) Teeth Intact, Caps, Dental Advisory Given   Pulmonary neg pulmonary ROS, Current Smoker,    Pulmonary exam normal breath sounds clear to auscultation       Cardiovascular Exercise Tolerance: Good hypertension, + dysrhythmias  Rhythm:regular Rate:Normal     Neuro/Psych negative neurological ROS  negative psych ROS   GI/Hepatic negative GI ROS, Neg liver ROS,   Endo/Other  diabetes, Type 2  Renal/GU negative Renal ROS  negative genitourinary   Musculoskeletal   Abdominal   Peds  Hematology negative hematology ROS (+)   Anesthesia Other Findings   Reproductive/Obstetrics negative OB ROS                            Anesthesia Physical Anesthesia Plan  ASA: 3  Anesthesia Plan: General   Post-op Pain Management: Ofirmev IV (intra-op)   Induction: Intravenous  PONV Risk Score and Plan: 2  Airway Management Planned: Oral ETT and LMA  Additional Equipment: None  Intra-op Plan:   Post-operative Plan: Extubation in OR  Informed Consent: I have reviewed the patients History and Physical, chart, labs and discussed the procedure including the risks, benefits and alternatives for the proposed anesthesia with the patient or authorized representative who has indicated his/her understanding and acceptance.     Dental Advisory Given  Plan Discussed with: CRNA and Anesthesiologist  Anesthesia Plan Comments: (  )        Anesthesia Quick Evaluation

## 2021-08-23 ENCOUNTER — Encounter (HOSPITAL_BASED_OUTPATIENT_CLINIC_OR_DEPARTMENT_OTHER): Payer: Self-pay | Admitting: Urology

## 2021-08-23 DIAGNOSIS — C61 Malignant neoplasm of prostate: Secondary | ICD-10-CM | POA: Diagnosis not present

## 2021-08-23 NOTE — Anesthesia Postprocedure Evaluation (Signed)
Anesthesia Post Note  Patient: Francisco Fleming  Procedure(s) Performed: RADIOACTIVE SEED IMPLANT/BRACHYTHERAPY IMPLANT (Prostate) SPACE OAR INSTILLATION (Perineum) CYSTOSCOPY (Bladder)     Patient location during evaluation: PACU Anesthesia Type: General Level of consciousness: awake and alert Pain management: pain level controlled Vital Signs Assessment: post-procedure vital signs reviewed and stable Respiratory status: spontaneous breathing, nonlabored ventilation, respiratory function stable and patient connected to nasal cannula oxygen Cardiovascular status: blood pressure returned to baseline and stable Postop Assessment: no apparent nausea or vomiting Anesthetic complications: no   No notable events documented.  Last Vitals:  Vitals:   08/22/21 1530 08/22/21 1600  BP: (!) 104/55 110/67  Pulse: (!) 56 66  Resp: 16 20  Temp:  36.6 C  SpO2: 98% 99%    Last Pain:  Vitals:   08/23/21 1030  TempSrc:   PainSc: 2    Pain Goal: Patients Stated Pain Goal: 7 (08/22/21 1123)                 Creola Krotz

## 2021-09-05 DIAGNOSIS — C61 Malignant neoplasm of prostate: Secondary | ICD-10-CM | POA: Diagnosis not present

## 2021-09-20 ENCOUNTER — Telehealth: Payer: Self-pay | Admitting: *Deleted

## 2021-09-20 NOTE — Telephone Encounter (Signed)
CALLED PATIENT TO REMIND OF POST SEED APPTS. FOR 09-21-21, SPOKE WITH PATIENT AND HE IS AWARE OF THESE APPTS.

## 2021-09-21 ENCOUNTER — Ambulatory Visit
Admission: RE | Admit: 2021-09-21 | Discharge: 2021-09-21 | Disposition: A | Discharge status: Home / Self Care | Payer: Medicare HMO | Source: Ambulatory Visit | Attending: Urology | Admitting: Urology

## 2021-09-21 ENCOUNTER — Encounter: Payer: Self-pay | Admitting: Urology

## 2021-09-21 ENCOUNTER — Other Ambulatory Visit: Payer: Self-pay

## 2021-09-21 ENCOUNTER — Encounter: Payer: Self-pay | Admitting: Radiation Oncology

## 2021-09-21 ENCOUNTER — Ambulatory Visit
Admission: RE | Admit: 2021-09-21 | Discharge: 2021-09-21 | Disposition: A | Discharge status: Home / Self Care | Payer: Medicare HMO | Source: Ambulatory Visit | Attending: Radiation Oncology | Admitting: Radiation Oncology

## 2021-09-21 VITALS — BP 138/64 | HR 68 | Temp 97.7°F | Resp 20 | Ht 69.5 in | Wt 175.6 lb

## 2021-09-21 DIAGNOSIS — Z923 Personal history of irradiation: Secondary | ICD-10-CM | POA: Insufficient documentation

## 2021-09-21 DIAGNOSIS — Z7984 Long term (current) use of oral hypoglycemic drugs: Secondary | ICD-10-CM | POA: Insufficient documentation

## 2021-09-21 DIAGNOSIS — R350 Frequency of micturition: Secondary | ICD-10-CM | POA: Insufficient documentation

## 2021-09-21 DIAGNOSIS — C61 Malignant neoplasm of prostate: Secondary | ICD-10-CM | POA: Insufficient documentation

## 2021-09-21 DIAGNOSIS — Z79899 Other long term (current) drug therapy: Secondary | ICD-10-CM | POA: Insufficient documentation

## 2021-09-21 DIAGNOSIS — R3911 Hesitancy of micturition: Secondary | ICD-10-CM | POA: Insufficient documentation

## 2021-09-21 NOTE — Progress Notes (Signed)
Radiation Oncology         [phone] ________________________________  Name: Francisco Fleming MRN: [id number]  Date: 09/21/2021  DOB: 1951-06-29  Post-Seed Follow-Up Visit Note  CC: Prince Solian, MD  Janith Lima, MD  Diagnosis:   71 y.o. gentleman with Stage T1c adenocarcinoma of the prostate with Gleason score of 3+4, and PSA of 3.33.    ICD-10-CM   1. Malignant neoplasm of prostate ([place])  C61       Interval Since Last Radiation:  4 weeks 08/22/21:  Insertion of radioactive I-125 seeds into the prostate gland; 145 Gy, definitive therapy with placement of SpaceOAR gel.  Narrative:  The patient returns today for routine follow-up.  He is complaining of increased urinary frequency and urinary hesitation symptoms. He filled out a questionnaire regarding urinary function today providing and overall IPSS score of 14 characterizing his symptoms as moderate with nocturia x2, weakened flow of stream, urgency, frequency and mild dysuria at the start of his stream.  He specifically denies gross hematuria, straining to void or incontinence.  He is not currently taking any medications for symptom management and is pleased with his progress to date.  His pre-implant score was 3. He denies any abdominal pain or bowel symptoms.  He does notice increased rectal pressure and occasionally has a false sense of needing to have a bowel movement but this is not painful and his bowel movements remain regular/soft without pain or hematochezia.  He reports a minimal change in his energy level but has been able to remain active.  ALLERGIES:  is allergic to penicillins.  Meds: Current Outpatient Medications  Medication Sig Dispense Refill   acetaminophen (TYLENOL) 500 MG tablet Take 500 mg by mouth every 6 (six) hours as needed.     amLODipine (NORVASC) 2.5 MG tablet Take 2.5 mg by mouth daily.     BD PEN NEEDLE NANO U/F 32G X 4 MM MISC USE ONE PEN NEEDLE PER TRESIBA INJECTION DAILY     docusate sodium  (COLACE) 100 MG capsule Take 1 capsule (100 mg total) by mouth daily as needed for up to 365 doses. 30 capsule 0   latanoprost (XALATAN) 0.005 % ophthalmic solution Place 1 drop into both eyes at bedtime.     metFORMIN (GLUCOPHAGE) 1000 MG tablet Take 1,000 mg by mouth 2 (two) times daily.  11   oxyCODONE-acetaminophen (PERCOCET) 5-325 MG tablet Take 1 tablet by mouth every 4 (four) hours as needed for up to 25 doses for severe pain. 25 tablet 0   pioglitazone (ACTOS) 45 MG tablet Take 22.5 mg by mouth daily.  11   rosuvastatin (CRESTOR) 10 MG tablet Take 10 mg by mouth daily.     telmisartan (MICARDIS) 80 MG tablet Take 80 mg by mouth daily.     TRESIBA FLEXTOUCH 100 UNIT/ML SOPN FlexTouch Pen Inject 22 Units into the skin daily.     No current facility-administered medications for this visit.    Physical Findings: In general this is a well appearing Caucasian male in no acute distress. He's alert and oriented x4 and appropriate throughout the examination. Cardiopulmonary assessment is negative for acute distress and he exhibits normal effort.   Lab Findings: Lab Results  Component Value Date   WBC 8.1 08/18/2021   HGB 14.4 08/18/2021   HCT 44.5 08/18/2021   MCV 99.6 08/18/2021   PLT 230 08/18/2021    Radiographic Findings:  Patient underwent CT imaging in our clinic for post implant dosimetry. The  CT will be reviewed by Dr. Tammi Klippel to confirm there is an adequate distribution of radioactive seeds throughout the prostate gland and ensure that there are no seeds in or near the rectum.  We suspect the final radiation plan and dosimetry will show appropriate coverage of the prostate gland. He understands that we will call and inform him of any unexpected findings on further review of his imaging and dosimetry.  Impression/Plan: 71 y.o. gentleman with Stage T1c adenocarcinoma of the prostate with Gleason score of 3+4, and PSA of 3.33. The patient is recovering from the effects of radiation.  His urinary symptoms should gradually improve over the next 4-6 months. We talked about this today. He is encouraged by his improvement already and is otherwise pleased with his outcome. We also talked about long-term follow-up for prostate cancer following seed implant. He understands that ongoing PSA determinations and digital rectal exams will help perform surveillance to rule out disease recurrence. He has a follow up appointment scheduled for labs on 12/06/2021 and will see Dr. Abner Greenspan the following week. He understands what to expect with his PSA measures. Patient was also educated today about some of the long-term effects from radiation including a small risk for rectal bleeding and possibly erectile dysfunction. We talked about some of the general management approaches to these potential complications. However, I did encourage the patient to contact our office or return at any point if he has questions or concerns related to his previous radiation and prostate cancer.    Nicholos Johns, PA-C

## 2021-09-21 NOTE — Progress Notes (Signed)
Patient reports dysuria w/ rectal pressure. Otherwise patient is doing well. No other symptoms reported at this time.  Meaningful use complete. I-PSS score of 14 (moderate).  No urinary management medications at this time. Urology follow-up w/ [facility] scheduled for March 23rd, 2023 -per patient.  BP 138/64 (BP Location: Right Arm, Patient Position: Sitting, Cuff Size: Normal)    Pulse 68    Temp 97.7 F (36.5 C)    Resp 20    Ht 5' 9.5" (1.765 m)    Wt 175 lb 9.6 oz (79.7 kg)    SpO2 99%    BMI 25.56 kg/m

## 2021-09-21 NOTE — Progress Notes (Signed)
°  Radiation Oncology         [phone] ________________________________  Name: Francisco Fleming MRN: [id number]  Date: 09/21/2021  DOB: Nov 16, 1950  COMPLEX SIMULATION NOTE  NARRATIVE:  The patient was brought to the [facility] today following prostate seed implantation approximately one month ago.  Identity was confirmed.  All relevant records and images related to the planned course of therapy were reviewed.  Then, the patient was set-up supine.  CT images were obtained.  The CT images were loaded into the planning software.  Then the prostate and rectum were contoured.  Treatment planning then occurred.  The implanted iodine 125 seeds were identified by the physics staff for projection of radiation distribution  I have requested : 3D Simulation  I have requested a DVH of the following structures: Prostate and rectum.    ________________________________  Sheral Apley Tammi Klippel, M.D.

## 2021-09-24 NOTE — Progress Notes (Signed)
°  Radiation Oncology         [phone] ________________________________  Name: DARRIC PLANTE MRN: [id number]  Date: 09/21/2021  DOB: [birth year]  3D Planning Note   Prostate Brachytherapy Post-Implant Dosimetry  Diagnosis: 71 y.o. gentleman with Stage T1c adenocarcinoma of the prostate with Gleason score of 3+4, and PSA of 3.33.  Narrative: On a previous date, BURDETT PINZON returned following prostate seed implantation for post implant planning. He underwent CT scan complex simulation to delineate the three-dimensional structures of the pelvis and demonstrate the radiation distribution.  Since that time, the seed localization, and complex isodose planning with dose volume histograms have now been completed.  Results:   Prostate Coverage - The dose of radiation delivered to the 90% or more of the prostate gland (D90) was 121.47% of the prescription dose. This exceeds our goal of greater than 90%. Rectal Sparing - The volume of rectal tissue receiving the prescription dose or higher was 0.0 cc. This falls under our thresholds tolerance of 1.0 cc.  Impression: The prostate seed implant appears to show adequate target coverage and appropriate rectal sparing.  Plan:  The patient will continue to follow with urology for ongoing PSA determinations. I would anticipate a high likelihood for local tumor control with minimal risk for rectal morbidity.  ________________________________  Sheral Apley Tammi Klippel, M.D.

## 2021-10-17 ENCOUNTER — Telehealth: Payer: Self-pay | Admitting: *Deleted

## 2021-10-18 ENCOUNTER — Telehealth: Payer: Self-pay | Admitting: *Deleted

## 2021-10-19 DIAGNOSIS — L570 Actinic keratosis: Secondary | ICD-10-CM | POA: Diagnosis not present

## 2021-10-19 DIAGNOSIS — L814 Other melanin hyperpigmentation: Secondary | ICD-10-CM | POA: Diagnosis not present

## 2021-10-19 DIAGNOSIS — L821 Other seborrheic keratosis: Secondary | ICD-10-CM | POA: Diagnosis not present

## 2021-10-19 DIAGNOSIS — D2371 Other benign neoplasm of skin of right lower limb, including hip: Secondary | ICD-10-CM | POA: Diagnosis not present

## 2021-10-21 ENCOUNTER — Telehealth: Payer: Self-pay | Admitting: *Deleted

## 2021-10-28 ENCOUNTER — Inpatient Hospital Stay: Payer: Medicare HMO | Admitting: *Deleted

## 2021-11-11 ENCOUNTER — Encounter: Payer: Self-pay | Admitting: *Deleted

## 2021-11-11 ENCOUNTER — Inpatient Hospital Stay: Payer: Medicare HMO | Attending: Urology | Admitting: *Deleted

## 2021-11-11 ENCOUNTER — Other Ambulatory Visit: Payer: Self-pay

## 2021-11-11 VITALS — BP 150/61 | HR 78 | Temp 98.2°F | Resp 20 | Ht 69.5 in | Wt 180.1 lb

## 2021-11-11 DIAGNOSIS — C61 Malignant neoplasm of prostate: Secondary | ICD-10-CM

## 2021-11-11 NOTE — Progress Notes (Signed)
SCP reviewed and completed. Pt has some urinary urgency and hesitancy, but he says it's getting better. At times pt states he has to go to bathroom twice in a row to empty his bladder completely. Pt says he sleeps very well only getting up to bathroom once or twice. nightly. Urine flow is fair, he says. Pt works part-time at [employer]. He walks quite a bit, about 5-6 miles on a busy day and walks every evening with wife. Pt will have labs done at [facility] on March 23rd and  will get annual physical on April 4th with PCP. Overall he seems to please with how he's feeling.

## 2021-11-22 DIAGNOSIS — H4031X4 Glaucoma secondary to eye trauma, right eye, indeterminate stage: Secondary | ICD-10-CM | POA: Diagnosis not present

## 2021-11-22 DIAGNOSIS — E119 Type 2 diabetes mellitus without complications: Secondary | ICD-10-CM | POA: Diagnosis not present

## 2021-11-25 DIAGNOSIS — L570 Actinic keratosis: Secondary | ICD-10-CM | POA: Diagnosis not present

## 2021-12-08 DIAGNOSIS — C61 Malignant neoplasm of prostate: Secondary | ICD-10-CM | POA: Diagnosis not present

## 2021-12-13 DIAGNOSIS — E1169 Type 2 diabetes mellitus with other specified complication: Secondary | ICD-10-CM | POA: Diagnosis not present

## 2021-12-13 DIAGNOSIS — E785 Hyperlipidemia, unspecified: Secondary | ICD-10-CM | POA: Diagnosis not present

## 2021-12-13 DIAGNOSIS — Z125 Encounter for screening for malignant neoplasm of prostate: Secondary | ICD-10-CM | POA: Diagnosis not present

## 2021-12-13 DIAGNOSIS — I10 Essential (primary) hypertension: Secondary | ICD-10-CM | POA: Diagnosis not present

## 2021-12-20 DIAGNOSIS — R82998 Other abnormal findings in urine: Secondary | ICD-10-CM | POA: Diagnosis not present

## 2021-12-20 DIAGNOSIS — E1169 Type 2 diabetes mellitus with other specified complication: Secondary | ICD-10-CM | POA: Diagnosis not present

## 2021-12-20 DIAGNOSIS — Z0000 Encounter for general adult medical examination without abnormal findings: Secondary | ICD-10-CM | POA: Diagnosis not present

## 2021-12-20 DIAGNOSIS — D126 Benign neoplasm of colon, unspecified: Secondary | ICD-10-CM | POA: Diagnosis not present

## 2021-12-20 DIAGNOSIS — E785 Hyperlipidemia, unspecified: Secondary | ICD-10-CM | POA: Diagnosis not present

## 2021-12-20 DIAGNOSIS — H4030X0 Glaucoma secondary to eye trauma, unspecified eye, stage unspecified: Secondary | ICD-10-CM | POA: Diagnosis not present

## 2021-12-20 DIAGNOSIS — I10 Essential (primary) hypertension: Secondary | ICD-10-CM | POA: Diagnosis not present

## 2021-12-20 DIAGNOSIS — C61 Malignant neoplasm of prostate: Secondary | ICD-10-CM | POA: Diagnosis not present

## 2021-12-20 DIAGNOSIS — M1990 Unspecified osteoarthritis, unspecified site: Secondary | ICD-10-CM | POA: Diagnosis not present

## 2022-02-14 DIAGNOSIS — C61 Malignant neoplasm of prostate: Secondary | ICD-10-CM | POA: Diagnosis not present

## 2022-02-20 DIAGNOSIS — C61 Malignant neoplasm of prostate: Secondary | ICD-10-CM | POA: Diagnosis not present

## 2022-05-01 DIAGNOSIS — E1169 Type 2 diabetes mellitus with other specified complication: Secondary | ICD-10-CM | POA: Diagnosis not present

## 2022-05-01 DIAGNOSIS — D126 Benign neoplasm of colon, unspecified: Secondary | ICD-10-CM | POA: Diagnosis not present

## 2022-05-01 DIAGNOSIS — C61 Malignant neoplasm of prostate: Secondary | ICD-10-CM | POA: Diagnosis not present

## 2022-05-01 DIAGNOSIS — E785 Hyperlipidemia, unspecified: Secondary | ICD-10-CM | POA: Diagnosis not present

## 2022-05-01 DIAGNOSIS — I10 Essential (primary) hypertension: Secondary | ICD-10-CM | POA: Diagnosis not present

## 2022-05-01 DIAGNOSIS — M1990 Unspecified osteoarthritis, unspecified site: Secondary | ICD-10-CM | POA: Diagnosis not present

## 2022-05-01 DIAGNOSIS — H4030X0 Glaucoma secondary to eye trauma, unspecified eye, stage unspecified: Secondary | ICD-10-CM | POA: Diagnosis not present

## 2022-06-03 DIAGNOSIS — Z7984 Long term (current) use of oral hypoglycemic drugs: Secondary | ICD-10-CM | POA: Diagnosis not present

## 2022-06-03 DIAGNOSIS — E119 Type 2 diabetes mellitus without complications: Secondary | ICD-10-CM | POA: Diagnosis not present

## 2022-06-03 DIAGNOSIS — I951 Orthostatic hypotension: Secondary | ICD-10-CM | POA: Diagnosis not present

## 2022-06-03 DIAGNOSIS — N529 Male erectile dysfunction, unspecified: Secondary | ICD-10-CM | POA: Diagnosis not present

## 2022-06-03 DIAGNOSIS — Z833 Family history of diabetes mellitus: Secondary | ICD-10-CM | POA: Diagnosis not present

## 2022-06-03 DIAGNOSIS — Z87891 Personal history of nicotine dependence: Secondary | ICD-10-CM | POA: Diagnosis not present

## 2022-06-03 DIAGNOSIS — Z8249 Family history of ischemic heart disease and other diseases of the circulatory system: Secondary | ICD-10-CM | POA: Diagnosis not present

## 2022-06-03 DIAGNOSIS — R32 Unspecified urinary incontinence: Secondary | ICD-10-CM | POA: Diagnosis not present

## 2022-06-03 DIAGNOSIS — Z809 Family history of malignant neoplasm, unspecified: Secondary | ICD-10-CM | POA: Diagnosis not present

## 2022-06-03 DIAGNOSIS — I10 Essential (primary) hypertension: Secondary | ICD-10-CM | POA: Diagnosis not present

## 2022-06-03 DIAGNOSIS — E785 Hyperlipidemia, unspecified: Secondary | ICD-10-CM | POA: Diagnosis not present

## 2022-06-03 DIAGNOSIS — Z794 Long term (current) use of insulin: Secondary | ICD-10-CM | POA: Diagnosis not present

## 2022-06-05 DIAGNOSIS — E755 Other lipid storage disorders: Secondary | ICD-10-CM | POA: Diagnosis not present

## 2022-07-20 DIAGNOSIS — R058 Other specified cough: Secondary | ICD-10-CM | POA: Diagnosis not present

## 2022-07-20 DIAGNOSIS — J069 Acute upper respiratory infection, unspecified: Secondary | ICD-10-CM | POA: Diagnosis not present

## 2022-08-15 DIAGNOSIS — Z1152 Encounter for screening for COVID-19: Secondary | ICD-10-CM | POA: Diagnosis not present

## 2022-08-15 DIAGNOSIS — R0981 Nasal congestion: Secondary | ICD-10-CM | POA: Diagnosis not present

## 2022-08-15 DIAGNOSIS — J069 Acute upper respiratory infection, unspecified: Secondary | ICD-10-CM | POA: Diagnosis not present

## 2022-08-15 DIAGNOSIS — R058 Other specified cough: Secondary | ICD-10-CM | POA: Diagnosis not present

## 2022-08-15 DIAGNOSIS — C61 Malignant neoplasm of prostate: Secondary | ICD-10-CM | POA: Diagnosis not present

## 2022-08-15 DIAGNOSIS — R5383 Other fatigue: Secondary | ICD-10-CM | POA: Diagnosis not present

## 2022-08-15 DIAGNOSIS — I10 Essential (primary) hypertension: Secondary | ICD-10-CM | POA: Diagnosis not present

## 2022-08-22 DIAGNOSIS — C61 Malignant neoplasm of prostate: Secondary | ICD-10-CM | POA: Diagnosis not present

## 2022-08-22 DIAGNOSIS — N5201 Erectile dysfunction due to arterial insufficiency: Secondary | ICD-10-CM | POA: Diagnosis not present

## 2022-09-22 DIAGNOSIS — J069 Acute upper respiratory infection, unspecified: Secondary | ICD-10-CM | POA: Diagnosis not present

## 2022-09-22 DIAGNOSIS — I10 Essential (primary) hypertension: Secondary | ICD-10-CM | POA: Diagnosis not present

## 2022-09-22 DIAGNOSIS — H4030X0 Glaucoma secondary to eye trauma, unspecified eye, stage unspecified: Secondary | ICD-10-CM | POA: Diagnosis not present

## 2022-09-22 DIAGNOSIS — C61 Malignant neoplasm of prostate: Secondary | ICD-10-CM | POA: Diagnosis not present

## 2022-09-22 DIAGNOSIS — M1990 Unspecified osteoarthritis, unspecified site: Secondary | ICD-10-CM | POA: Diagnosis not present

## 2022-09-22 DIAGNOSIS — T300 Burn of unspecified body region, unspecified degree: Secondary | ICD-10-CM | POA: Diagnosis not present

## 2022-09-22 DIAGNOSIS — E785 Hyperlipidemia, unspecified: Secondary | ICD-10-CM | POA: Diagnosis not present

## 2022-09-22 DIAGNOSIS — D126 Benign neoplasm of colon, unspecified: Secondary | ICD-10-CM | POA: Diagnosis not present

## 2022-09-22 DIAGNOSIS — E1169 Type 2 diabetes mellitus with other specified complication: Secondary | ICD-10-CM | POA: Diagnosis not present

## 2022-09-26 IMAGING — MR MR PROSTATE WO/W CM
13 series · 48 of 48 positions shown · IV contrast (15 ml multihance)
Comparison: 05/31/2020 CT stone study.  Biopsy results of 07/27/20

CLINICAL DATA: Prostate cancer diagnosed 6-8 months ago with PSA
4.9.

EXAM:
MR PROSTATE WITHOUT AND WITH CONTRAST
TECHNIQUE: Multiplanar multisequence MRI images were obtained of the pelvis
centered about the prostate. Pre and post contrast images were
obtained.
CONTRAST:  15mL MULTIHANCE GADOBENATE DIMEGLUMINE 529 MG/ML IV SOLN

[Series 2: new loc · axial · 6.0mm · 0.88mm/px · 1 of 17 slices shown]
[im 1/17]
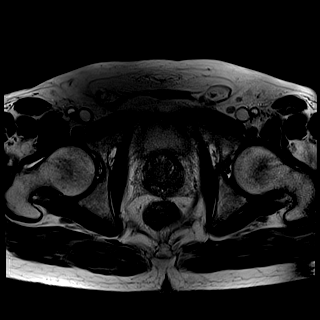

[Series 3: T2 · coronal · 3.0mm · 0.56mm/px · 1 of 23 slices shown (1 of 3)]
[im 1/23]
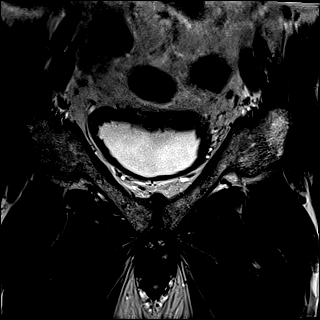

[Series 4: T1 · axial · 5.0mm · 1.25mm/px · 1 of 80 slices shown]
[im 1/80]
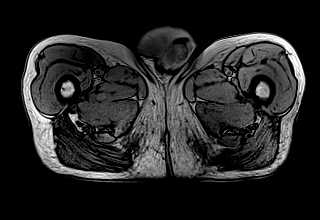

[Series 5: DWI · axial · 3.0mm · 1.75mm/px · z∈[-51,+18]mm · 2 of 72 slices shown (1 of 3)]
[im 1/72]
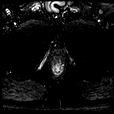
[im 72/72]
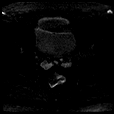

[Series 6: DWI · axial · 3.0mm · 1.75mm/px · 1 of 24 slices shown (2 of 3)]
[im 1/24]
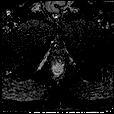

[Series 7: DWI · axial · 3.0mm · 1.75mm/px · 1 of 24 slices shown (3 of 3)]
[im 1/24]
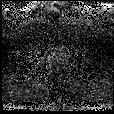

[Series 8: T2 · axial · 3.0mm · 0.56mm/px · 1 of 25 slices shown (2 of 3)]
[im 1/25]
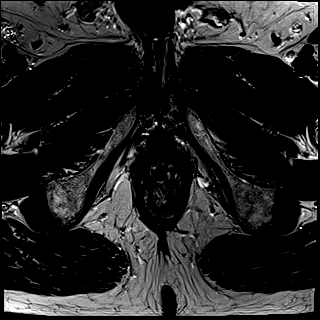

[Series 9: T2 · axial · 1.0mm · 1.04mm/px · z∈[-52,+19]mm · 2 of 72 slices shown (3 of 3)]
[im 1/72]
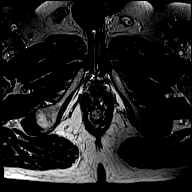
[im 72/72]
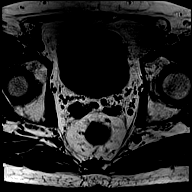

[Series 10: pre t1_twist_tra_dyn · axial · non-contrast · 3.5mm · 0.83mm/px · 1 of 20 slices shown]
[im 1/20]
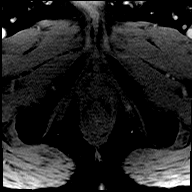

[Series 11: post t1_twist_tra_dyn-copy center · axial · non-contrast · 3.5mm · 0.83mm/px · z∈[-50,+16]mm · 17 of 600 slices shown]
[im 1/600]
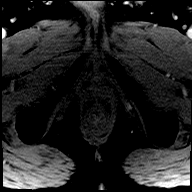
[im 38/600]
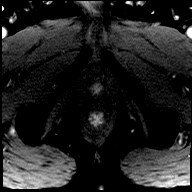
[im 75/600]
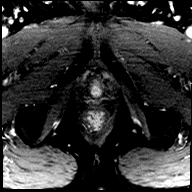
[im 113/600]
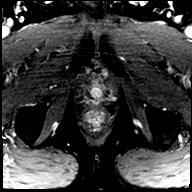
[im 150/600]
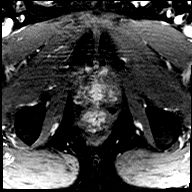
[im 188/600]
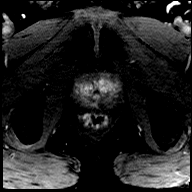
[im 225/600]
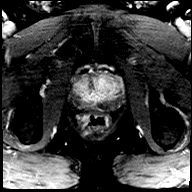
[im 263/600]
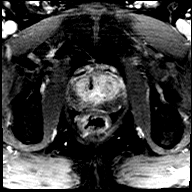
[im 300/600]
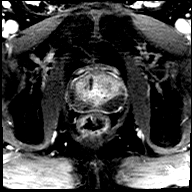
[im 337/600]
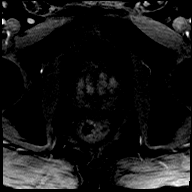
[im 375/600]
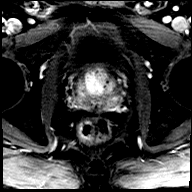
[im 412/600]
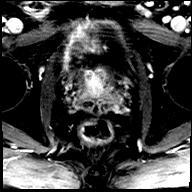
[im 450/600]
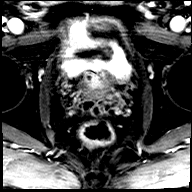
[im 487/600]
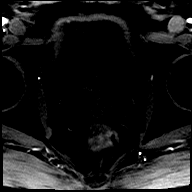
[im 525/600]
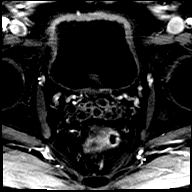
[im 562/600]
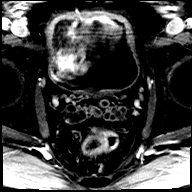
[im 600/600]
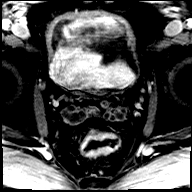

[Series 12: post t1_twist_tra_dyn-copy cent_sub · axial · 3.5mm · 0.83mm/px · z∈[-50,+16]mm · 16 of 576 slices shown]
[im 1/576]
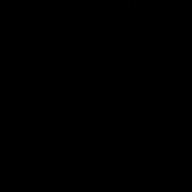
[im 39/576]
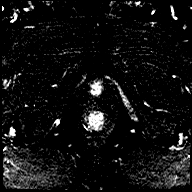
[im 77/576]
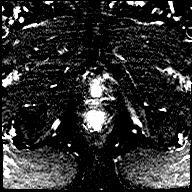
[im 116/576]
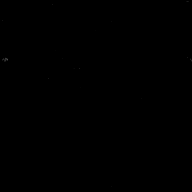
[im 154/576]
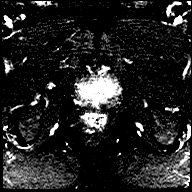
[im 192/576]
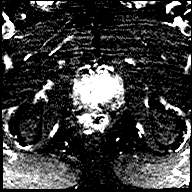
[im 231/576]
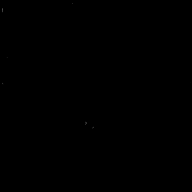
[im 269/576]
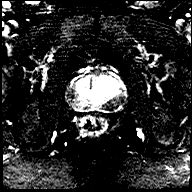
[im 307/576]
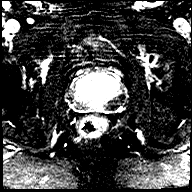
[im 346/576]
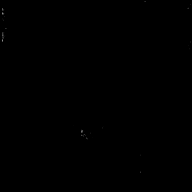
[im 384/576]
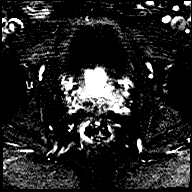
[im 422/576]
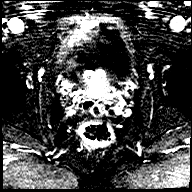
[im 461/576]
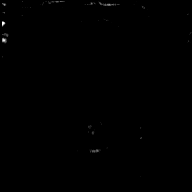
[im 499/576]
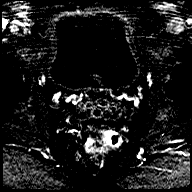
[im 537/576]
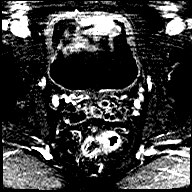
[im 576/576]
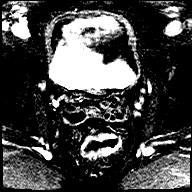

[Series 13: t1_vibe_dixon_tra_f · axial · 2.5mm · 0.91mm/px · z∈[-99,+98]mm · 2 of 80 slices shown]
[im 1/80]
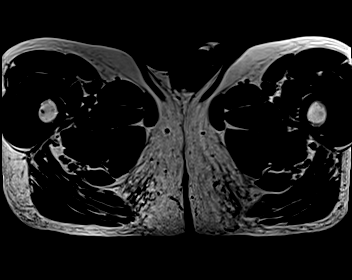
[im 80/80]
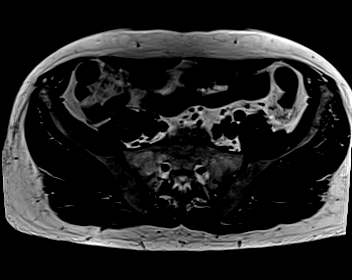

[Series 14: t1_vibe_dixon_tra_w · axial · 2.5mm · 0.91mm/px · z∈[-99,+98]mm · 2 of 80 slices shown]
[im 1/80]
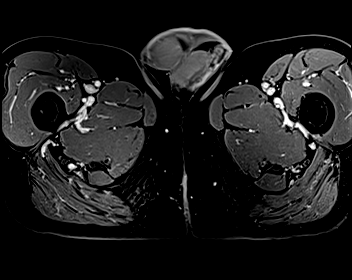
[im 80/80]
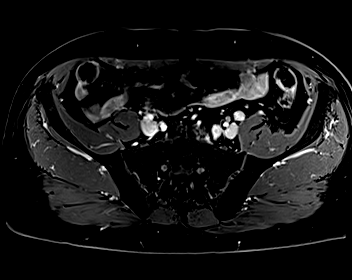

[48 of 48 positions shown; findings below may reference images not displayed]

FINDINGS: Prostate: Mild central gland heterogeneity, consistent with benign
prostatic hyperplasia. A 1.1 x 1.2 cm anterior left mid to apical
central gland nodule including on [DATE] demonstrates moderate
homogeneous T2 hypointensity and partially non circumscribed
margins. Considered PI-RADS 4.

No areas of peripheral zone T2 hypointensity, restricted diffusion,
or masslike early post-contrast enhancement. Lateral left base T2
hypointensity on [DATE] is favored to represent extruded BPH nodule.

Volume: 4.8 x 3.1 x 4.1 cm (volume = 32 cm^3)

Transcapsular spread:  Absent

Seminal vesicle involvement: Absent

Neurovascular bundle involvement: Absent

Pelvic adenopathy: Absent

Bone metastasis: Absent

Other findings: No significant free fluid. Normal urinary bladder.
Small right-sided hydrocele is likely physiologic.
IMPRESSION: 1. Anterior left mid to apical central gland nodule is considered
PI-RADS(v2.1)-4.
2. No evidence of high-grade or macroscopic peripheral zone
carcinoma.
3. No locally advanced or pelvic metastatic disease.

## 2022-10-19 DIAGNOSIS — L821 Other seborrheic keratosis: Secondary | ICD-10-CM | POA: Diagnosis not present

## 2022-10-19 DIAGNOSIS — D225 Melanocytic nevi of trunk: Secondary | ICD-10-CM | POA: Diagnosis not present

## 2022-10-19 DIAGNOSIS — L570 Actinic keratosis: Secondary | ICD-10-CM | POA: Diagnosis not present

## 2022-10-19 DIAGNOSIS — L814 Other melanin hyperpigmentation: Secondary | ICD-10-CM | POA: Diagnosis not present

## 2022-10-19 DIAGNOSIS — D2239 Melanocytic nevi of other parts of face: Secondary | ICD-10-CM | POA: Diagnosis not present

## 2022-10-27 DIAGNOSIS — H903 Sensorineural hearing loss, bilateral: Secondary | ICD-10-CM | POA: Diagnosis not present

## 2022-11-06 DIAGNOSIS — J309 Allergic rhinitis, unspecified: Secondary | ICD-10-CM | POA: Diagnosis not present

## 2022-11-06 DIAGNOSIS — I10 Essential (primary) hypertension: Secondary | ICD-10-CM | POA: Diagnosis not present

## 2022-11-06 DIAGNOSIS — J4521 Mild intermittent asthma with (acute) exacerbation: Secondary | ICD-10-CM | POA: Diagnosis not present

## 2022-11-06 DIAGNOSIS — J069 Acute upper respiratory infection, unspecified: Secondary | ICD-10-CM | POA: Diagnosis not present

## 2022-11-21 ENCOUNTER — Ambulatory Visit
Admission: EM | Admit: 2022-11-21 | Discharge: 2022-11-21 | Disposition: A | Discharge status: Home / Self Care | Payer: Medicare HMO | Attending: Emergency Medicine | Admitting: Emergency Medicine

## 2022-11-21 DIAGNOSIS — J329 Chronic sinusitis, unspecified: Secondary | ICD-10-CM

## 2022-11-21 DIAGNOSIS — J40 Bronchitis, not specified as acute or chronic: Secondary | ICD-10-CM | POA: Diagnosis not present

## 2022-11-21 DIAGNOSIS — R0981 Nasal congestion: Secondary | ICD-10-CM

## 2022-11-21 MED ORDER — CEFDINIR 300 MG PO CAPS: 300.0000 mg | ORAL_CAPSULE | Freq: Two times a day (BID) | ORAL | 0 refills | Status: DC

## 2022-11-21 MED ORDER — MONTELUKAST SODIUM 10 MG PO TABS: 10.0000 mg | ORAL_TABLET | Freq: Every day | ORAL | 2 refills | Status: AC

## 2022-11-21 MED ORDER — PREDNISONE 10 MG (21) PO TBPK: ORAL_TABLET | Freq: Every day | ORAL | 0 refills | Status: DC

## 2022-11-21 NOTE — Discharge Instructions (Addendum)
Please take antibiotic as prescribed. Take with food to avoid upset stomach.  Take the prednisone taper as directed on the package. You will decrease by 10 mg each day.  Continue montelukast daily with flonase.  Please follow with ENT for further evaluation and management

## 2022-11-21 NOTE — ED Provider Notes (Signed)
[facility]    CSN: [id number] Arrival date & time: 11/21/22  1559      History   Chief Complaint Chief Complaint  Patient presents with   Cough   Nasal Congestion    HPI Francisco Fleming is a 72 y.o. male.  Reports months long history of cough and congestion Comes and goes but overall pretty persistent. Has seen PCP 3x in the last 3 months. At first tried allergy med and flonase. Then was given steroid shot and zpack. Third visit he had another steroid shot and was put on Singulair  Referral to ENT placed, appointment 4/17 No fevers.  Hx of sinus surgery  Past Medical History:  Diagnosis Date   History of adenomatous polyp of colon    History of COVID-19 06/18/2021   08-16-2021 pt stated had mild symptoms that resolved but then had sinus infection and the flu with residual dry cough   History of eye injury    per pt right eye trauma from hit with baseball which caused increased eye pressure , uses eye drop   History of kidney stones 2021   Hyperplasia of prostate with lower urinary tract symptoms (LUTS)    Hypertension    Incomplete right bundle branch block (RBBB)    Prostate cancer ([place]) 07/2020   urologist--- dr gay;  dx 11/ 2021 Gleason 3+3 active survillence;  biopsy 09/ 2022 Gleason 3+4   Type 2 diabetes mellitus ([place])    followed  by pcp  (08-16-2021 pt stated check blood sugar once weekly fasting but did not know average)   Wears glasses     Patient Active Problem List   Diagnosis Date Noted   Malignant neoplasm of prostate ([place]) 08/27/2020   COLONIC POLYPS 08/24/2008   DM 08/24/2008   HYPERLIPIDEMIA 08/24/2008   HYPERTENSION 08/24/2008    Past Surgical History:  Procedure Laterality Date   CATARACT EXTRACTION W/ INTRAOCULAR LENS IMPLANT Right 2016   COLONOSCOPY  07/2019   by dr Henrene Pastor   CYSTOSCOPY  08/22/2021   Procedure: CYSTOSCOPY;  Surgeon: Janith Lima, MD;  Location: [facility];  Service: Urology;;   LUMBAR  LAMINECTOMY     1970s;    L3--L4   NASAL SINUS SURGERY     2002 approx   RADIOACTIVE SEED IMPLANT N/A 08/22/2021   Procedure: RADIOACTIVE SEED IMPLANT/BRACHYTHERAPY IMPLANT;  Surgeon: Janith Lima, MD;  Location[facility];  Service: Urology;  Laterality: N/A;  66 seeds implanted   SHOULDER ARTHROSCOPY WITH ROTATOR CUFF REPAIR AND OPEN BICEPS TENODESIS Left 09/2018   SPACE OAR INSTILLATION N/A 08/22/2021   Procedure: SPACE OAR INSTILLATION;  Surgeon: Janith Lima, MD;  Location: [facility];  Service: Urology;  Laterality: N/A;   TONSILLECTOMY     child       Home Medications    Prior to Admission medications   Medication Sig Start Date End Date Taking? Authorizing Provider  cefdinir (OMNICEF) 300 MG capsule Take 1 capsule (300 mg total) by mouth 2 (two) times daily for 5 days. 11/21/22 11/26/22 Yes Costa Jha, Wells Guiles, PA-C  montelukast (SINGULAIR) 10 MG tablet Take 1 tablet (10 mg total) by mouth daily. 11/21/22  Yes Kishia Shackett, Wells Guiles, PA-C  predniSONE (STERAPRED UNI-PAK 21 TAB) 10 MG (21) TBPK tablet Take by mouth daily. Follow instructions on packaging 11/21/22  Yes Ailynn Gow, Wells Guiles, PA-C  acetaminophen (TYLENOL) 500 MG tablet Take 500 mg by mouth every 6 (six) hours as needed.  [provider]  amLODipine (NORVASC) 2.5 MG tablet Take 2.5 mg by mouth daily. 07/17/20   [provider]  BD PEN NEEDLE NANO U/F 32G X 4 MM MISC USE ONE PEN NEEDLE PER TRESIBA INJECTION DAILY 04/24/19   [provider]  latanoprost (XALATAN) 0.005 % ophthalmic solution Place 1 drop into both eyes at bedtime. 07/08/19   [provider]  metFORMIN (GLUCOPHAGE) 1000 MG tablet Take 1,000 mg by mouth 2 (two) times daily. 02/22/16   [provider]  pioglitazone (ACTOS) 45 MG tablet Take 22.5 mg by mouth daily. 03/16/16   [provider]  rosuvastatin (CRESTOR) 10 MG tablet Take 10 mg by mouth daily. 06/04/19   [provider]   telmisartan (MICARDIS) 80 MG tablet Take 80 mg by mouth daily. 05/08/19   [provider]  TRESIBA FLEXTOUCH 100 UNIT/ML SOPN FlexTouch Pen Inject 22 Units into the skin daily. 03/06/19   [provider]    Family History Family History  Problem Relation Age of Onset   Colon polyps Mother    Cervical cancer Mother    Lung cancer Brother        non smoker. served on nuclear carriers in the [employer].   Esophageal cancer Neg Hx    Rectal cancer Neg Hx    Stomach cancer Neg Hx    Breast cancer Neg Hx    Pancreatic cancer Neg Hx    Prostate cancer Neg Hx    Colon cancer Neg Hx     Social History Social History   Tobacco Use   Smoking status: Some Days    Types: Cigars   Smokeless tobacco: Former    Types: Chew    Quit date: 1989   Tobacco comments:    smokes cigars occasionally when playing golf  Vaping Use   Vaping Use: Never used  Substance Use Topics   Alcohol use: No    Alcohol/week: 0.0 standard drinks of alcohol   Drug use: Never     Allergies   Penicillins   Review of Systems Review of Systems Per HPI  Physical Exam Triage Vital Signs ED Triage Vitals  Enc Vitals Group     BP 11/21/22 1605 (!) 180/74     Pulse Rate 11/21/22 1605 74     Resp 11/21/22 1605 12     Temp 11/21/22 1605 97.6 F (36.4 C)     Temp Source 11/21/22 1605 Oral     SpO2 11/21/22 1605 98 %     Weight --      Height --      Head Circumference --      Peak Flow --      Pain Score 11/21/22 1608 0     Pain Loc --      Pain Edu? --      Excl. in [place]? --    No data found.  Updated Vital Signs BP (!) 180/74 (BP Location: Right Arm)   Pulse 74   Temp 97.6 F (36.4 C) (Oral)   Resp 12   SpO2 98%   Visual Acuity Right Eye Distance:   Left Eye Distance:   Bilateral Distance:    Right Eye Near:   Left Eye Near:    Bilateral Near:     Physical Exam Vitals and nursing note reviewed.  Constitutional:      General: He is not in acute distress. HENT:      Right Ear: Tympanic membrane and ear canal normal.  Left Ear: Tympanic membrane and ear canal normal.     Nose: No septal deviation or rhinorrhea.     Right Turbinates: Swollen.     Left Turbinates: Swollen.     Mouth/Throat:     Mouth: Mucous membranes are moist.     Pharynx: Oropharynx is clear. No posterior oropharyngeal erythema.     Comments: PND Cardiovascular:     Rate and Rhythm: Normal rate and regular rhythm.     Pulses: Normal pulses.     Heart sounds: Normal heart sounds.  Pulmonary:     Effort: Pulmonary effort is normal.     Breath sounds: Normal breath sounds.  Musculoskeletal:     Cervical back: Normal range of motion.  Skin:    General: Skin is warm and dry.  Neurological:     Mental Status: He is alert and oriented to person, place, and time.     UC Treatments / Results  Labs (all labs ordered are listed, but only abnormal results are displayed) Labs Reviewed - No data to display  EKG  Radiology No results found.  Procedures Procedures (including critical care time)  Medications Ordered in UC Medications - No data to display  Initial Impression / Assessment and Plan / UC Course  I have reviewed the triage vital signs and the nursing notes.  Pertinent labs & imaging results that were available during my care of the patient were reviewed by me and considered in my medical decision making (see chart for details).  With his chronic congestion, really needs to see ENT specialist for further eval. Appointment in [place] is next month.  I have provided him with the [facility] info in case he can get in sooner. In short term we will try prednisone taper.  He has history of diabetes but well-controlled.  I recommend to monitor sugars while using this medicine. Additionally will cover for bacterial etiology with cefdinir BID x 5 days.(PCN allergy rash) Recommend to continue daily allergy med and nasal spray  Final Clinical Impressions(s) /  UC Diagnoses   Final diagnoses:  Chronic nasal congestion  Sinobronchitis     Discharge Instructions      Please take antibiotic as prescribed. Take with food to avoid upset stomach.  Take the prednisone taper as directed on the package. You will decrease by 10 mg each day.  Continue montelukast daily with flonase.  Please follow with ENT for further evaluation and management      ED Prescriptions     Medication Sig Dispense Auth. Provider   predniSONE (STERAPRED UNI-PAK 21 TAB) 10 MG (21) TBPK tablet Take by mouth daily. Follow instructions on packaging 21 tablet Teondra [place]burg, PA-C   cefdinir (OMNICEF) 300 MG capsule Take 1 capsule (300 mg total) by mouth 2 (two) times daily for 5 days. 10 capsule Fransheska Willingham, PA-C   montelukast (SINGULAIR) 10 MG tablet Take 1 tablet (10 mg total) by mouth daily. 30 tablet Joziyah Roblero, Wells Guiles, PA-C      PDMP not reviewed this encounter.   [place], [place] 11/21/22 [id number]

## 2022-11-21 NOTE — ED Triage Notes (Signed)
Pt presents with c/o cough and congestion for "months". Pt states he has been seen by his PCP and was prescribed a z pak, steroid shot, has taken OTC allergy medication with no relief.

## 2022-11-26 ENCOUNTER — Other Ambulatory Visit: Payer: Self-pay

## 2022-11-26 ENCOUNTER — Ambulatory Visit (INDEPENDENT_AMBULATORY_CARE_PROVIDER_SITE_OTHER): Payer: Medicare HMO

## 2022-11-26 ENCOUNTER — Encounter: Payer: Self-pay | Admitting: Emergency Medicine

## 2022-11-26 ENCOUNTER — Ambulatory Visit
Admission: EM | Admit: 2022-11-26 | Discharge: 2022-11-26 | Disposition: A | Discharge status: Home / Self Care | Payer: Medicare HMO | Attending: Urgent Care | Admitting: Urgent Care

## 2022-11-26 DIAGNOSIS — H259 Unspecified age-related cataract: Secondary | ICD-10-CM | POA: Diagnosis not present

## 2022-11-26 DIAGNOSIS — J3489 Other specified disorders of nose and nasal sinuses: Secondary | ICD-10-CM | POA: Diagnosis not present

## 2022-11-26 DIAGNOSIS — N529 Male erectile dysfunction, unspecified: Secondary | ICD-10-CM | POA: Diagnosis not present

## 2022-11-26 DIAGNOSIS — Z8249 Family history of ischemic heart disease and other diseases of the circulatory system: Secondary | ICD-10-CM | POA: Diagnosis not present

## 2022-11-26 DIAGNOSIS — M1990 Unspecified osteoarthritis, unspecified site: Secondary | ICD-10-CM | POA: Diagnosis not present

## 2022-11-26 DIAGNOSIS — E1136 Type 2 diabetes mellitus with diabetic cataract: Secondary | ICD-10-CM | POA: Diagnosis not present

## 2022-11-26 DIAGNOSIS — Z008 Encounter for other general examination: Secondary | ICD-10-CM | POA: Diagnosis not present

## 2022-11-26 DIAGNOSIS — J309 Allergic rhinitis, unspecified: Secondary | ICD-10-CM | POA: Diagnosis not present

## 2022-11-26 DIAGNOSIS — J321 Chronic frontal sinusitis: Secondary | ICD-10-CM | POA: Diagnosis not present

## 2022-11-26 DIAGNOSIS — N189 Chronic kidney disease, unspecified: Secondary | ICD-10-CM | POA: Diagnosis not present

## 2022-11-26 DIAGNOSIS — E1122 Type 2 diabetes mellitus with diabetic chronic kidney disease: Secondary | ICD-10-CM | POA: Diagnosis not present

## 2022-11-26 DIAGNOSIS — Z794 Long term (current) use of insulin: Secondary | ICD-10-CM | POA: Diagnosis not present

## 2022-11-26 DIAGNOSIS — H409 Unspecified glaucoma: Secondary | ICD-10-CM | POA: Diagnosis not present

## 2022-11-26 DIAGNOSIS — J40 Bronchitis, not specified as acute or chronic: Secondary | ICD-10-CM | POA: Diagnosis not present

## 2022-11-26 DIAGNOSIS — E785 Hyperlipidemia, unspecified: Secondary | ICD-10-CM | POA: Diagnosis not present

## 2022-11-26 MED ORDER — QNASL 80 MCG/ACT NA AERS
2.0000 | INHALATION_SPRAY | Freq: Every day | NASAL | 0 refills | Status: AC
Start: 2022-11-26 — End: ?

## 2022-11-26 NOTE — ED Triage Notes (Addendum)
Patient presents to Urgent Care with complaints of chronic sinuses(nasal congestion). Patient reports history of nasal polyps. He does have an appointment with ENT in April 5 but wanting some relief. Has completed abx(Omincef) and steroids from previous visit. He just finished the abx and prednisone today. Currently using the Singulair and Flonase daily. He does have a cough when nasal congestion flares

## 2022-11-26 NOTE — ED Provider Notes (Signed)
[facility]    CSN: [id number] Arrival date & time: 11/26/22  1437      History   Chief Complaint Chief Complaint  Patient presents with   Nasal Congestion    HPI Francisco Fleming is a 72 y.o. male.   Pleasant 72 year old male presents today due to concerns primarily of frontal sinus pressure.  He denies any severe pain.  He states he has been having this pressure constantly and daily for several months.  He has seen doctors 5 times for the same symptoms.  He has been on several rounds of oral antibiotics as well as oral and IM steroids.  He continues to try antihistamines, Flonase, and Singulair.  He states even while on the medications, there has been no improvement to his symptoms.  He does have a history of nasal polyps which have been surgically removed in the past.  He denies any fevers.  No pressure behind his eyes.  No significant nasal congestion or postnasal drainage.  His chief complaint is frontal sinus pressure primarily on the right.  He denies any cough or other URI symptoms. Has appointment with ENT early April.      Past Medical History:  Diagnosis Date   History of adenomatous polyp of colon    History of COVID-19 06/18/2021   08-16-2021 pt stated had mild symptoms that resolved but then had sinus infection and the flu with residual dry cough   History of eye injury    per pt right eye trauma from hit with baseball which caused increased eye pressure , uses eye drop   History of kidney stones 2021   Hyperplasia of prostate with lower urinary tract symptoms (LUTS)    Hypertension    Incomplete right bundle branch block (RBBB)    Prostate cancer ([place]) 07/2020   urologist--- dr gay;  dx 11/ 2021 Gleason 3+3 active survillence;  biopsy 09/ 2022 Gleason 3+4   Type 2 diabetes mellitus ([place])    followed  by pcp  (08-16-2021 pt stated check blood sugar once weekly fasting but did not know average)   Wears glasses     Patient Active Problem List    Diagnosis Date Noted   Malignant neoplasm of prostate ([place]) 08/27/2020   COLONIC POLYPS 08/24/2008   DM 08/24/2008   HYPERLIPIDEMIA 08/24/2008   HYPERTENSION 08/24/2008    Past Surgical History:  Procedure Laterality Date   CATARACT EXTRACTION W/ INTRAOCULAR LENS IMPLANT Right 2016   COLONOSCOPY  07/2019   by dr Henrene Pastor   CYSTOSCOPY  08/22/2021   Procedure: CYSTOSCOPY;  Surgeon: Janith Lima, MD;  Location: [place];  Service: Urology;;   LUMBAR LAMINECTOMY     1970s;    L3--L4   NASAL SINUS SURGERY     2002 approx   RADIOACTIVE SEED IMPLANT N/A 08/22/2021   Procedure: RADIOACTIVE SEED IMPLANT/BRACHYTHERAPY IMPLANT;  Surgeon: Janith Lima, MD;  Location[facility];  Service: Urology;  Laterality: N/A;  66 seeds implanted   SHOULDER ARTHROSCOPY WITH ROTATOR CUFF REPAIR AND OPEN BICEPS TENODESIS Left 09/2018   SPACE OAR INSTILLATION N/A 08/22/2021   Procedure: SPACE OAR INSTILLATION;  Surgeon: Janith Lima, MD;  Location: [facility];  Service: Urology;  Laterality: N/A;   TONSILLECTOMY     child       Home Medications    Prior to Admission medications   Medication Sig Start Date End Date Taking? Authorizing Provider  acetaminophen (TYLENOL) 500  MG tablet Take 500 mg by mouth every 6 (six) hours as needed.   Yes [provider]  amLODipine (NORVASC) 2.5 MG tablet Take 2.5 mg by mouth daily. 07/17/20  Yes [provider]  BD PEN NEEDLE NANO U/F 32G X 4 MM MISC USE ONE PEN NEEDLE PER TRESIBA INJECTION DAILY 04/24/19  Yes [provider]  Beclomethasone Dipropionate (QNASL) 80 MCG/ACT AERS Place 2 sprays into the nose daily. 11/26/22  Yes Kemiah Booz L, PA  latanoprost (XALATAN) 0.005 % ophthalmic solution Place 1 drop into both eyes at bedtime. 07/08/19  Yes [provider]  metFORMIN (GLUCOPHAGE) 1000 MG tablet Take 1,000 mg by mouth 2 (two) times daily. 02/22/16  Yes [provider]   montelukast (SINGULAIR) 10 MG tablet Take 1 tablet (10 mg total) by mouth daily. 11/21/22  Yes Rising, Wells Guiles, PA-C  pioglitazone (ACTOS) 45 MG tablet Take 22.5 mg by mouth daily. 03/16/16  Yes [provider]  rosuvastatin (CRESTOR) 10 MG tablet Take 10 mg by mouth daily. 06/04/19  Yes [provider]  telmisartan (MICARDIS) 80 MG tablet Take 80 mg by mouth daily. 05/08/19  Yes [provider]  TRESIBA FLEXTOUCH 100 UNIT/ML SOPN FlexTouch Pen Inject 22 Units into the skin daily. 03/06/19  Yes [provider]    Family History Family History  Problem Relation Age of Onset   Colon polyps Mother    Cervical cancer Mother    Lung cancer Brother        non smoker. served on nuclear carriers in the [employer].   Esophageal cancer Neg Hx    Rectal cancer Neg Hx    Stomach cancer Neg Hx    Breast cancer Neg Hx    Pancreatic cancer Neg Hx    Prostate cancer Neg Hx    Colon cancer Neg Hx     Social History Social History   Tobacco Use   Smoking status: Some Days    Types: Cigars   Smokeless tobacco: Former    Types: Chew    Quit date: 1989   Tobacco comments:    smokes cigars occasionally when playing golf  Vaping Use   Vaping Use: Never used  Substance Use Topics   Alcohol use: No    Alcohol/week: 0.0 standard drinks of alcohol   Drug use: Never     Allergies   Penicillins   Review of Systems Review of Systems As per HPI  Physical Exam Triage Vital Signs ED Triage Vitals  Enc Vitals Group     BP 11/26/22 1455 (!) 175/74     Pulse Rate 11/26/22 1455 74     Resp 11/26/22 1455 16     Temp 11/26/22 1455 98.3 F (36.8 C)     Temp Source 11/26/22 1455 Oral     SpO2 11/26/22 1455 95 %     Weight --      Height --      Head Circumference --      Peak Flow --      Pain Score 11/26/22 1452 0     Pain Loc --      Pain Edu? --      Excl. in [place]? --    No data found.  Updated Vital Signs BP (!) 175/74 (BP Location: Right Arm)   Pulse 74    Temp 98.3 F (36.8 C) (Oral)   Resp 16   SpO2 95%   Visual Acuity Right Eye Distance:   Left Eye Distance:  Bilateral Distance:    Right Eye Near:   Left Eye Near:    Bilateral Near:     Physical Exam Vitals and nursing note reviewed.  Constitutional:      General: He is not in acute distress.    Appearance: Normal appearance. He is normal weight. He is not ill-appearing or toxic-appearing.  HENT:     Head: Normocephalic and atraumatic.     Right Ear: Tympanic membrane, ear canal and external ear normal. There is no impacted cerumen.     Left Ear: Tympanic membrane, ear canal and external ear normal. There is no impacted cerumen.     Nose: Congestion present. No rhinorrhea.     Right Turbinates: Not enlarged or swollen.     Left Turbinates: Not enlarged or swollen.     Right Sinus: Frontal sinus tenderness present. No maxillary sinus tenderness.     Left Sinus: No maxillary sinus tenderness or frontal sinus tenderness.     Mouth/Throat:     Mouth: Mucous membranes are moist.     Pharynx: Oropharynx is clear. No oropharyngeal exudate or posterior oropharyngeal erythema.  Eyes:     General: No scleral icterus.       Right eye: No discharge.        Left eye: No discharge.     Extraocular Movements: Extraocular movements intact.     Conjunctiva/sclera: Conjunctivae normal.     Pupils: Pupils are equal, round, and reactive to light.  Cardiovascular:     Rate and Rhythm: Normal rate and regular rhythm.  Pulmonary:     Effort: Pulmonary effort is normal. No respiratory distress.     Breath sounds: Normal breath sounds. No stridor. No wheezing or rhonchi.  Musculoskeletal:     Cervical back: Normal range of motion and neck supple. No rigidity or tenderness.  Lymphadenopathy:     Cervical: No cervical adenopathy.  Neurological:     Mental Status: He is alert.      UC Treatments / Results  Labs (all labs ordered are listed, but only abnormal results are  displayed) Labs Reviewed - No data to display  EKG   Radiology DG Sinuses Complete  Result Date: 11/26/2022 CLINICAL DATA:  Chronic frontal sinus pain EXAM: PARANASAL SINUSES - COMPLETE 3 + VIEW COMPARISON:  03/24/2016 FINDINGS: The paranasal sinus are aerated. There is no evidence of sinus opacification air-fluid levels or mucosal thickening. No significant bone abnormalities are seen. IMPRESSION: Negative. Electronically Signed   By: Davina Poke D.O.   On: 11/26/2022 15:29    Procedures Procedures (including critical care time)  Medications Ordered in UC Medications - No data to display  Initial Impression / Assessment and Plan / UC Course  I have reviewed the triage vital signs and the nursing notes.  Pertinent labs & imaging results that were available during my care of the patient were reviewed by me and considered in my medical decision making (see chart for details).     Frontal sinus pain -this has been refractory to several rounds of steroids and antibiotics.  X-ray done in clinic does not show evidence of mucosal thickening or air-fluid levels, therefore another round of antibiotics is not indicated.  I will recommend that patient stop his Flonase and switch to Qnasl as this has a better possibility of reaching the frontal sinuses.  I also encouraged patient to monitor his blood pressure as this may be causing some of the pressure in his head.  He is on amlodipine  and telmisartan daily, encouraged patient to monitor at home and follow-up with PCP if blood pressure remains elevated. No red flag s/sx in office necessitating ER referral.   Final Clinical Impressions(s) / UC Diagnoses   Final diagnoses:  Frontal sinus pain     Discharge Instructions      Your sinus x-ray does not show any evidence of air-fluid levels or mucus. Antibiotics will likely not improve your symptoms. I have called in Qnasl for you to use, which is a potent nasal powder to take the place of  your current Flonase. Please monitor your blood pressure strictly. Elevated blood pressure can sometimes cause headaches as well. Our goal blood pressure should be closer to 120/80, please avoid decongestants such as Sudafed or phenylephrine.     ED Prescriptions     Medication Sig Dispense Auth. Provider   Beclomethasone Dipropionate (QNASL) 80 MCG/ACT AERS Place 2 sprays into the nose daily. 10.6 g Wylan Gentzler L, [place]      PDMP not reviewed this encounter.   Chaney Malling, [place] 11/26/22 1614

## 2022-11-26 NOTE — Discharge Instructions (Signed)
Your sinus x-ray does not show any evidence of air-fluid levels or mucus. Antibiotics will likely not improve your symptoms. I have called in Qnasl for you to use, which is a potent nasal powder to take the place of your current Flonase. Please monitor your blood pressure strictly. Elevated blood pressure can sometimes cause headaches as well. Our goal blood pressure should be closer to 120/80, please avoid decongestants such as Sudafed or phenylephrine.

## 2022-11-27 ENCOUNTER — Telehealth: Payer: Self-pay | Admitting: Emergency Medicine

## 2022-11-27 MED ORDER — MOMETASONE FUROATE 50 MCG/ACT NA SUSP: 2.0000 | Freq: Every day | NASAL | 0 refills | Status: AC

## 2022-11-27 NOTE — Telephone Encounter (Signed)
Call to [facility] this am - nasal spray prescribed on 11/26/22 was $300 out of pocket per patient so he did not get it filled. Flonase has not worked in past. Dr Meda Coffee will review & send in an alternative nasal spray - listed on pharmacy fax yesterday. No other questions at this time

## 2022-11-27 NOTE — Telephone Encounter (Signed)
I reviewed alternatives.  Going to prescribe Nasonex.

## 2022-11-27 NOTE — Telephone Encounter (Signed)
Fax from [facility] stated medication was not covered under pt's insurance- this RN spoke to provider Derenda Fennel, PA) who stated she had spoken to pt prior to discharge to home to make him aware that medication was probably not covered under his insurance & it would be an out of pocket  cost for him. Alternatives suggested by pharmacy were mometasone furoate 63mg spray or flunisolide 0.025% spray. This RN will follow up with pt in am

## 2022-11-28 DIAGNOSIS — Z961 Presence of intraocular lens: Secondary | ICD-10-CM | POA: Diagnosis not present

## 2022-11-28 DIAGNOSIS — H2512 Age-related nuclear cataract, left eye: Secondary | ICD-10-CM | POA: Diagnosis not present

## 2022-11-28 DIAGNOSIS — H4031X4 Glaucoma secondary to eye trauma, right eye, indeterminate stage: Secondary | ICD-10-CM | POA: Diagnosis not present

## 2022-12-20 DIAGNOSIS — R0981 Nasal congestion: Secondary | ICD-10-CM | POA: Diagnosis not present

## 2022-12-20 DIAGNOSIS — R0982 Postnasal drip: Secondary | ICD-10-CM | POA: Diagnosis not present

## 2023-01-01 DIAGNOSIS — Z125 Encounter for screening for malignant neoplasm of prostate: Secondary | ICD-10-CM | POA: Diagnosis not present

## 2023-01-01 DIAGNOSIS — E1169 Type 2 diabetes mellitus with other specified complication: Secondary | ICD-10-CM | POA: Diagnosis not present

## 2023-01-01 DIAGNOSIS — I10 Essential (primary) hypertension: Secondary | ICD-10-CM | POA: Diagnosis not present

## 2023-01-01 DIAGNOSIS — E785 Hyperlipidemia, unspecified: Secondary | ICD-10-CM | POA: Diagnosis not present

## 2023-01-08 DIAGNOSIS — Z23 Encounter for immunization: Secondary | ICD-10-CM | POA: Diagnosis not present

## 2023-01-08 DIAGNOSIS — C61 Malignant neoplasm of prostate: Secondary | ICD-10-CM | POA: Diagnosis not present

## 2023-01-08 DIAGNOSIS — J069 Acute upper respiratory infection, unspecified: Secondary | ICD-10-CM | POA: Diagnosis not present

## 2023-01-08 DIAGNOSIS — I10 Essential (primary) hypertension: Secondary | ICD-10-CM | POA: Diagnosis not present

## 2023-01-08 DIAGNOSIS — Z1331 Encounter for screening for depression: Secondary | ICD-10-CM | POA: Diagnosis not present

## 2023-01-08 DIAGNOSIS — D126 Benign neoplasm of colon, unspecified: Secondary | ICD-10-CM | POA: Diagnosis not present

## 2023-01-08 DIAGNOSIS — E785 Hyperlipidemia, unspecified: Secondary | ICD-10-CM | POA: Diagnosis not present

## 2023-01-08 DIAGNOSIS — M1990 Unspecified osteoarthritis, unspecified site: Secondary | ICD-10-CM | POA: Diagnosis not present

## 2023-01-08 DIAGNOSIS — E1169 Type 2 diabetes mellitus with other specified complication: Secondary | ICD-10-CM | POA: Diagnosis not present

## 2023-01-08 DIAGNOSIS — Z1339 Encounter for screening examination for other mental health and behavioral disorders: Secondary | ICD-10-CM | POA: Diagnosis not present

## 2023-01-08 DIAGNOSIS — Z0000 Encounter for general adult medical examination without abnormal findings: Secondary | ICD-10-CM | POA: Diagnosis not present

## 2023-01-24 DIAGNOSIS — R0981 Nasal congestion: Secondary | ICD-10-CM | POA: Diagnosis not present

## 2023-01-24 DIAGNOSIS — J3489 Other specified disorders of nose and nasal sinuses: Secondary | ICD-10-CM | POA: Diagnosis not present

## 2023-01-24 DIAGNOSIS — R0982 Postnasal drip: Secondary | ICD-10-CM | POA: Diagnosis not present

## 2023-02-13 DIAGNOSIS — C61 Malignant neoplasm of prostate: Secondary | ICD-10-CM | POA: Diagnosis not present

## 2023-02-20 DIAGNOSIS — N5201 Erectile dysfunction due to arterial insufficiency: Secondary | ICD-10-CM | POA: Diagnosis not present

## 2023-02-20 DIAGNOSIS — C61 Malignant neoplasm of prostate: Secondary | ICD-10-CM | POA: Diagnosis not present

## 2023-05-31 DIAGNOSIS — R69 Illness, unspecified: Secondary | ICD-10-CM | POA: Diagnosis not present

## 2023-06-11 DIAGNOSIS — M545 Low back pain, unspecified: Secondary | ICD-10-CM | POA: Diagnosis not present

## 2023-06-15 DIAGNOSIS — M545 Low back pain, unspecified: Secondary | ICD-10-CM | POA: Diagnosis not present

## 2023-06-28 DIAGNOSIS — M545 Low back pain, unspecified: Secondary | ICD-10-CM | POA: Diagnosis not present

## 2023-07-05 DIAGNOSIS — M5416 Radiculopathy, lumbar region: Secondary | ICD-10-CM | POA: Diagnosis not present

## 2023-07-26 DIAGNOSIS — M5416 Radiculopathy, lumbar region: Secondary | ICD-10-CM | POA: Diagnosis not present

## 2023-07-31 DIAGNOSIS — H6123 Impacted cerumen, bilateral: Secondary | ICD-10-CM | POA: Diagnosis not present

## 2023-07-31 DIAGNOSIS — H9193 Unspecified hearing loss, bilateral: Secondary | ICD-10-CM | POA: Diagnosis not present

## 2023-08-01 DIAGNOSIS — B369 Superficial mycosis, unspecified: Secondary | ICD-10-CM | POA: Diagnosis not present

## 2023-08-01 DIAGNOSIS — H6123 Impacted cerumen, bilateral: Secondary | ICD-10-CM | POA: Diagnosis not present

## 2023-08-09 DIAGNOSIS — M9902 Segmental and somatic dysfunction of thoracic region: Secondary | ICD-10-CM | POA: Diagnosis not present

## 2023-08-09 DIAGNOSIS — M5417 Radiculopathy, lumbosacral region: Secondary | ICD-10-CM | POA: Diagnosis not present

## 2023-08-09 DIAGNOSIS — M5137 Other intervertebral disc degeneration, lumbosacral region with discogenic back pain only: Secondary | ICD-10-CM | POA: Diagnosis not present

## 2023-08-09 DIAGNOSIS — M9905 Segmental and somatic dysfunction of pelvic region: Secondary | ICD-10-CM | POA: Diagnosis not present

## 2023-08-09 DIAGNOSIS — M9903 Segmental and somatic dysfunction of lumbar region: Secondary | ICD-10-CM | POA: Diagnosis not present

## 2023-08-10 DIAGNOSIS — M25511 Pain in right shoulder: Secondary | ICD-10-CM | POA: Diagnosis not present

## 2023-08-10 DIAGNOSIS — M5412 Radiculopathy, cervical region: Secondary | ICD-10-CM | POA: Diagnosis not present

## 2023-08-13 DIAGNOSIS — M5137 Other intervertebral disc degeneration, lumbosacral region with discogenic back pain only: Secondary | ICD-10-CM | POA: Diagnosis not present

## 2023-08-13 DIAGNOSIS — M9903 Segmental and somatic dysfunction of lumbar region: Secondary | ICD-10-CM | POA: Diagnosis not present

## 2023-08-13 DIAGNOSIS — M5417 Radiculopathy, lumbosacral region: Secondary | ICD-10-CM | POA: Diagnosis not present

## 2023-08-13 DIAGNOSIS — M9905 Segmental and somatic dysfunction of pelvic region: Secondary | ICD-10-CM | POA: Diagnosis not present

## 2023-08-13 DIAGNOSIS — M9902 Segmental and somatic dysfunction of thoracic region: Secondary | ICD-10-CM | POA: Diagnosis not present

## 2023-08-15 DIAGNOSIS — M9905 Segmental and somatic dysfunction of pelvic region: Secondary | ICD-10-CM | POA: Diagnosis not present

## 2023-08-15 DIAGNOSIS — M9902 Segmental and somatic dysfunction of thoracic region: Secondary | ICD-10-CM | POA: Diagnosis not present

## 2023-08-15 DIAGNOSIS — M9903 Segmental and somatic dysfunction of lumbar region: Secondary | ICD-10-CM | POA: Diagnosis not present

## 2023-08-15 DIAGNOSIS — M5137 Other intervertebral disc degeneration, lumbosacral region with discogenic back pain only: Secondary | ICD-10-CM | POA: Diagnosis not present

## 2023-08-15 DIAGNOSIS — M5417 Radiculopathy, lumbosacral region: Secondary | ICD-10-CM | POA: Diagnosis not present

## 2023-08-21 DIAGNOSIS — M9903 Segmental and somatic dysfunction of lumbar region: Secondary | ICD-10-CM | POA: Diagnosis not present

## 2023-08-21 DIAGNOSIS — M5137 Other intervertebral disc degeneration, lumbosacral region with discogenic back pain only: Secondary | ICD-10-CM | POA: Diagnosis not present

## 2023-08-21 DIAGNOSIS — M5417 Radiculopathy, lumbosacral region: Secondary | ICD-10-CM | POA: Diagnosis not present

## 2023-08-21 DIAGNOSIS — M9902 Segmental and somatic dysfunction of thoracic region: Secondary | ICD-10-CM | POA: Diagnosis not present

## 2023-08-21 DIAGNOSIS — M9905 Segmental and somatic dysfunction of pelvic region: Secondary | ICD-10-CM | POA: Diagnosis not present

## 2023-08-23 DIAGNOSIS — M9903 Segmental and somatic dysfunction of lumbar region: Secondary | ICD-10-CM | POA: Diagnosis not present

## 2023-08-23 DIAGNOSIS — M5417 Radiculopathy, lumbosacral region: Secondary | ICD-10-CM | POA: Diagnosis not present

## 2023-08-23 DIAGNOSIS — M9905 Segmental and somatic dysfunction of pelvic region: Secondary | ICD-10-CM | POA: Diagnosis not present

## 2023-08-23 DIAGNOSIS — M5137 Other intervertebral disc degeneration, lumbosacral region with discogenic back pain only: Secondary | ICD-10-CM | POA: Diagnosis not present

## 2023-08-23 DIAGNOSIS — M9902 Segmental and somatic dysfunction of thoracic region: Secondary | ICD-10-CM | POA: Diagnosis not present

## 2023-08-28 DIAGNOSIS — M9905 Segmental and somatic dysfunction of pelvic region: Secondary | ICD-10-CM | POA: Diagnosis not present

## 2023-08-28 DIAGNOSIS — M9902 Segmental and somatic dysfunction of thoracic region: Secondary | ICD-10-CM | POA: Diagnosis not present

## 2023-08-28 DIAGNOSIS — M9903 Segmental and somatic dysfunction of lumbar region: Secondary | ICD-10-CM | POA: Diagnosis not present

## 2023-08-28 DIAGNOSIS — M5417 Radiculopathy, lumbosacral region: Secondary | ICD-10-CM | POA: Diagnosis not present

## 2023-08-28 DIAGNOSIS — M5137 Other intervertebral disc degeneration, lumbosacral region with discogenic back pain only: Secondary | ICD-10-CM | POA: Diagnosis not present

## 2023-08-30 DIAGNOSIS — M9903 Segmental and somatic dysfunction of lumbar region: Secondary | ICD-10-CM | POA: Diagnosis not present

## 2023-08-30 DIAGNOSIS — M9905 Segmental and somatic dysfunction of pelvic region: Secondary | ICD-10-CM | POA: Diagnosis not present

## 2023-08-30 DIAGNOSIS — M5137 Other intervertebral disc degeneration, lumbosacral region with discogenic back pain only: Secondary | ICD-10-CM | POA: Diagnosis not present

## 2023-08-30 DIAGNOSIS — M5417 Radiculopathy, lumbosacral region: Secondary | ICD-10-CM | POA: Diagnosis not present

## 2023-08-30 DIAGNOSIS — M9902 Segmental and somatic dysfunction of thoracic region: Secondary | ICD-10-CM | POA: Diagnosis not present

## 2023-09-03 DIAGNOSIS — B369 Superficial mycosis, unspecified: Secondary | ICD-10-CM | POA: Diagnosis not present

## 2023-09-04 DIAGNOSIS — M9902 Segmental and somatic dysfunction of thoracic region: Secondary | ICD-10-CM | POA: Diagnosis not present

## 2023-09-04 DIAGNOSIS — M9905 Segmental and somatic dysfunction of pelvic region: Secondary | ICD-10-CM | POA: Diagnosis not present

## 2023-09-04 DIAGNOSIS — M5137 Other intervertebral disc degeneration, lumbosacral region with discogenic back pain only: Secondary | ICD-10-CM | POA: Diagnosis not present

## 2023-09-04 DIAGNOSIS — M5417 Radiculopathy, lumbosacral region: Secondary | ICD-10-CM | POA: Diagnosis not present

## 2023-09-04 DIAGNOSIS — M9903 Segmental and somatic dysfunction of lumbar region: Secondary | ICD-10-CM | POA: Diagnosis not present

## 2023-09-06 DIAGNOSIS — M5417 Radiculopathy, lumbosacral region: Secondary | ICD-10-CM | POA: Diagnosis not present

## 2023-09-06 DIAGNOSIS — M9905 Segmental and somatic dysfunction of pelvic region: Secondary | ICD-10-CM | POA: Diagnosis not present

## 2023-09-06 DIAGNOSIS — M9902 Segmental and somatic dysfunction of thoracic region: Secondary | ICD-10-CM | POA: Diagnosis not present

## 2023-09-06 DIAGNOSIS — M5137 Other intervertebral disc degeneration, lumbosacral region with discogenic back pain only: Secondary | ICD-10-CM | POA: Diagnosis not present

## 2023-09-06 DIAGNOSIS — M9903 Segmental and somatic dysfunction of lumbar region: Secondary | ICD-10-CM | POA: Diagnosis not present

## 2023-09-11 DIAGNOSIS — M5137 Other intervertebral disc degeneration, lumbosacral region with discogenic back pain only: Secondary | ICD-10-CM | POA: Diagnosis not present

## 2023-09-11 DIAGNOSIS — M9905 Segmental and somatic dysfunction of pelvic region: Secondary | ICD-10-CM | POA: Diagnosis not present

## 2023-09-11 DIAGNOSIS — M9902 Segmental and somatic dysfunction of thoracic region: Secondary | ICD-10-CM | POA: Diagnosis not present

## 2023-09-11 DIAGNOSIS — M5417 Radiculopathy, lumbosacral region: Secondary | ICD-10-CM | POA: Diagnosis not present

## 2023-09-11 DIAGNOSIS — M9903 Segmental and somatic dysfunction of lumbar region: Secondary | ICD-10-CM | POA: Diagnosis not present

## 2023-09-25 DIAGNOSIS — M9902 Segmental and somatic dysfunction of thoracic region: Secondary | ICD-10-CM | POA: Diagnosis not present

## 2023-09-25 DIAGNOSIS — M5137 Other intervertebral disc degeneration, lumbosacral region with discogenic back pain only: Secondary | ICD-10-CM | POA: Diagnosis not present

## 2023-09-25 DIAGNOSIS — M9903 Segmental and somatic dysfunction of lumbar region: Secondary | ICD-10-CM | POA: Diagnosis not present

## 2023-09-25 DIAGNOSIS — M5417 Radiculopathy, lumbosacral region: Secondary | ICD-10-CM | POA: Diagnosis not present

## 2023-09-25 DIAGNOSIS — M9905 Segmental and somatic dysfunction of pelvic region: Secondary | ICD-10-CM | POA: Diagnosis not present

## 2023-10-02 DIAGNOSIS — M1990 Unspecified osteoarthritis, unspecified site: Secondary | ICD-10-CM | POA: Diagnosis not present

## 2023-10-02 DIAGNOSIS — D692 Other nonthrombocytopenic purpura: Secondary | ICD-10-CM | POA: Diagnosis not present

## 2023-10-02 DIAGNOSIS — M5442 Lumbago with sciatica, left side: Secondary | ICD-10-CM | POA: Diagnosis not present

## 2023-10-02 DIAGNOSIS — H4030X0 Glaucoma secondary to eye trauma, unspecified eye, stage unspecified: Secondary | ICD-10-CM | POA: Diagnosis not present

## 2023-10-02 DIAGNOSIS — C61 Malignant neoplasm of prostate: Secondary | ICD-10-CM | POA: Diagnosis not present

## 2023-10-02 DIAGNOSIS — E785 Hyperlipidemia, unspecified: Secondary | ICD-10-CM | POA: Diagnosis not present

## 2023-10-02 DIAGNOSIS — I10 Essential (primary) hypertension: Secondary | ICD-10-CM | POA: Diagnosis not present

## 2023-10-02 DIAGNOSIS — M5441 Lumbago with sciatica, right side: Secondary | ICD-10-CM | POA: Diagnosis not present

## 2023-10-02 DIAGNOSIS — D126 Benign neoplasm of colon, unspecified: Secondary | ICD-10-CM | POA: Diagnosis not present

## 2023-10-02 DIAGNOSIS — E1169 Type 2 diabetes mellitus with other specified complication: Secondary | ICD-10-CM | POA: Diagnosis not present

## 2023-10-23 DIAGNOSIS — D225 Melanocytic nevi of trunk: Secondary | ICD-10-CM | POA: Diagnosis not present

## 2023-10-23 DIAGNOSIS — L814 Other melanin hyperpigmentation: Secondary | ICD-10-CM | POA: Diagnosis not present

## 2023-10-23 DIAGNOSIS — D2239 Melanocytic nevi of other parts of face: Secondary | ICD-10-CM | POA: Diagnosis not present

## 2023-10-23 DIAGNOSIS — L821 Other seborrheic keratosis: Secondary | ICD-10-CM | POA: Diagnosis not present

## 2023-12-06 DIAGNOSIS — H4031X4 Glaucoma secondary to eye trauma, right eye, indeterminate stage: Secondary | ICD-10-CM | POA: Diagnosis not present

## 2023-12-06 DIAGNOSIS — E119 Type 2 diabetes mellitus without complications: Secondary | ICD-10-CM | POA: Diagnosis not present

## 2023-12-26 DIAGNOSIS — M48062 Spinal stenosis, lumbar region with neurogenic claudication: Secondary | ICD-10-CM | POA: Diagnosis not present

## 2023-12-26 DIAGNOSIS — Z6825 Body mass index (BMI) 25.0-25.9, adult: Secondary | ICD-10-CM | POA: Diagnosis not present

## 2023-12-26 DIAGNOSIS — M48061 Spinal stenosis, lumbar region without neurogenic claudication: Secondary | ICD-10-CM | POA: Diagnosis not present

## 2023-12-28 ENCOUNTER — Other Ambulatory Visit: Payer: Self-pay | Admitting: Neurosurgery

## 2024-01-09 DIAGNOSIS — E785 Hyperlipidemia, unspecified: Secondary | ICD-10-CM | POA: Diagnosis not present

## 2024-01-09 DIAGNOSIS — I10 Essential (primary) hypertension: Secondary | ICD-10-CM | POA: Diagnosis not present

## 2024-01-09 DIAGNOSIS — C61 Malignant neoplasm of prostate: Secondary | ICD-10-CM | POA: Diagnosis not present

## 2024-01-09 DIAGNOSIS — E1169 Type 2 diabetes mellitus with other specified complication: Secondary | ICD-10-CM | POA: Diagnosis not present

## 2024-01-09 DIAGNOSIS — Z1212 Encounter for screening for malignant neoplasm of rectum: Secondary | ICD-10-CM | POA: Diagnosis not present

## 2024-01-24 DIAGNOSIS — D692 Other nonthrombocytopenic purpura: Secondary | ICD-10-CM | POA: Diagnosis not present

## 2024-01-24 DIAGNOSIS — E1169 Type 2 diabetes mellitus with other specified complication: Secondary | ICD-10-CM | POA: Diagnosis not present

## 2024-01-24 DIAGNOSIS — I10 Essential (primary) hypertension: Secondary | ICD-10-CM | POA: Diagnosis not present

## 2024-01-24 DIAGNOSIS — M5441 Lumbago with sciatica, right side: Secondary | ICD-10-CM | POA: Diagnosis not present

## 2024-01-24 DIAGNOSIS — C61 Malignant neoplasm of prostate: Secondary | ICD-10-CM | POA: Diagnosis not present

## 2024-01-24 DIAGNOSIS — Z0000 Encounter for general adult medical examination without abnormal findings: Secondary | ICD-10-CM | POA: Diagnosis not present

## 2024-01-24 DIAGNOSIS — H4030X0 Glaucoma secondary to eye trauma, unspecified eye, stage unspecified: Secondary | ICD-10-CM | POA: Diagnosis not present

## 2024-01-24 DIAGNOSIS — M5442 Lumbago with sciatica, left side: Secondary | ICD-10-CM | POA: Diagnosis not present

## 2024-01-24 DIAGNOSIS — D126 Benign neoplasm of colon, unspecified: Secondary | ICD-10-CM | POA: Diagnosis not present

## 2024-01-24 DIAGNOSIS — Z1339 Encounter for screening examination for other mental health and behavioral disorders: Secondary | ICD-10-CM | POA: Diagnosis not present

## 2024-01-24 DIAGNOSIS — E785 Hyperlipidemia, unspecified: Secondary | ICD-10-CM | POA: Diagnosis not present

## 2024-01-24 DIAGNOSIS — Z1331 Encounter for screening for depression: Secondary | ICD-10-CM | POA: Diagnosis not present

## 2024-02-15 DIAGNOSIS — C61 Malignant neoplasm of prostate: Secondary | ICD-10-CM | POA: Diagnosis not present

## 2024-02-21 DIAGNOSIS — C61 Malignant neoplasm of prostate: Secondary | ICD-10-CM | POA: Diagnosis not present

## 2024-02-21 DIAGNOSIS — N5201 Erectile dysfunction due to arterial insufficiency: Secondary | ICD-10-CM | POA: Diagnosis not present

## 2024-02-22 DIAGNOSIS — M5414 Radiculopathy, thoracic region: Secondary | ICD-10-CM | POA: Diagnosis not present

## 2024-02-22 DIAGNOSIS — M9902 Segmental and somatic dysfunction of thoracic region: Secondary | ICD-10-CM | POA: Diagnosis not present

## 2024-02-22 DIAGNOSIS — M531 Cervicobrachial syndrome: Secondary | ICD-10-CM | POA: Diagnosis not present

## 2024-02-22 DIAGNOSIS — M9901 Segmental and somatic dysfunction of cervical region: Secondary | ICD-10-CM | POA: Diagnosis not present

## 2024-03-04 DIAGNOSIS — M5414 Radiculopathy, thoracic region: Secondary | ICD-10-CM | POA: Diagnosis not present

## 2024-03-04 DIAGNOSIS — M9901 Segmental and somatic dysfunction of cervical region: Secondary | ICD-10-CM | POA: Diagnosis not present

## 2024-03-04 DIAGNOSIS — M9902 Segmental and somatic dysfunction of thoracic region: Secondary | ICD-10-CM | POA: Diagnosis not present

## 2024-03-04 DIAGNOSIS — M531 Cervicobrachial syndrome: Secondary | ICD-10-CM | POA: Diagnosis not present

## 2024-03-06 DIAGNOSIS — M9902 Segmental and somatic dysfunction of thoracic region: Secondary | ICD-10-CM | POA: Diagnosis not present

## 2024-03-06 DIAGNOSIS — M531 Cervicobrachial syndrome: Secondary | ICD-10-CM | POA: Diagnosis not present

## 2024-03-06 DIAGNOSIS — M9901 Segmental and somatic dysfunction of cervical region: Secondary | ICD-10-CM | POA: Diagnosis not present

## 2024-03-06 DIAGNOSIS — M5414 Radiculopathy, thoracic region: Secondary | ICD-10-CM | POA: Diagnosis not present

## 2024-03-10 ENCOUNTER — Other Ambulatory Visit: Payer: Self-pay | Admitting: Internal Medicine

## 2024-03-10 ENCOUNTER — Encounter: Payer: Self-pay | Admitting: Internal Medicine

## 2024-03-10 DIAGNOSIS — R9389 Abnormal findings on diagnostic imaging of other specified body structures: Secondary | ICD-10-CM

## 2024-03-11 ENCOUNTER — Ambulatory Visit
Admission: RE | Admit: 2024-03-11 | Discharge: 2024-03-11 | Disposition: A | Discharge status: Home / Self Care | Source: Ambulatory Visit | Attending: Internal Medicine | Admitting: Internal Medicine

## 2024-03-11 DIAGNOSIS — R9389 Abnormal findings on diagnostic imaging of other specified body structures: Secondary | ICD-10-CM

## 2024-03-11 DIAGNOSIS — M5414 Radiculopathy, thoracic region: Secondary | ICD-10-CM | POA: Diagnosis not present

## 2024-03-11 DIAGNOSIS — M531 Cervicobrachial syndrome: Secondary | ICD-10-CM | POA: Diagnosis not present

## 2024-03-11 DIAGNOSIS — I6523 Occlusion and stenosis of bilateral carotid arteries: Secondary | ICD-10-CM | POA: Diagnosis not present

## 2024-03-11 DIAGNOSIS — M9901 Segmental and somatic dysfunction of cervical region: Secondary | ICD-10-CM | POA: Diagnosis not present

## 2024-03-11 DIAGNOSIS — M9902 Segmental and somatic dysfunction of thoracic region: Secondary | ICD-10-CM | POA: Diagnosis not present

## 2024-03-13 DIAGNOSIS — M9901 Segmental and somatic dysfunction of cervical region: Secondary | ICD-10-CM | POA: Diagnosis not present

## 2024-03-13 DIAGNOSIS — M531 Cervicobrachial syndrome: Secondary | ICD-10-CM | POA: Diagnosis not present

## 2024-03-13 DIAGNOSIS — M5414 Radiculopathy, thoracic region: Secondary | ICD-10-CM | POA: Diagnosis not present

## 2024-03-13 DIAGNOSIS — M9902 Segmental and somatic dysfunction of thoracic region: Secondary | ICD-10-CM | POA: Diagnosis not present

## 2024-03-18 DIAGNOSIS — M5414 Radiculopathy, thoracic region: Secondary | ICD-10-CM | POA: Diagnosis not present

## 2024-03-18 DIAGNOSIS — M9902 Segmental and somatic dysfunction of thoracic region: Secondary | ICD-10-CM | POA: Diagnosis not present

## 2024-03-18 DIAGNOSIS — M531 Cervicobrachial syndrome: Secondary | ICD-10-CM | POA: Diagnosis not present

## 2024-03-18 DIAGNOSIS — M9901 Segmental and somatic dysfunction of cervical region: Secondary | ICD-10-CM | POA: Diagnosis not present

## 2024-03-25 DIAGNOSIS — M9901 Segmental and somatic dysfunction of cervical region: Secondary | ICD-10-CM | POA: Diagnosis not present

## 2024-03-25 DIAGNOSIS — M5414 Radiculopathy, thoracic region: Secondary | ICD-10-CM | POA: Diagnosis not present

## 2024-03-25 DIAGNOSIS — M9902 Segmental and somatic dysfunction of thoracic region: Secondary | ICD-10-CM | POA: Diagnosis not present

## 2024-03-25 DIAGNOSIS — M531 Cervicobrachial syndrome: Secondary | ICD-10-CM | POA: Diagnosis not present

## 2024-03-27 DIAGNOSIS — M9901 Segmental and somatic dysfunction of cervical region: Secondary | ICD-10-CM | POA: Diagnosis not present

## 2024-03-27 DIAGNOSIS — M9902 Segmental and somatic dysfunction of thoracic region: Secondary | ICD-10-CM | POA: Diagnosis not present

## 2024-03-27 DIAGNOSIS — M531 Cervicobrachial syndrome: Secondary | ICD-10-CM | POA: Diagnosis not present

## 2024-03-27 DIAGNOSIS — M5414 Radiculopathy, thoracic region: Secondary | ICD-10-CM | POA: Diagnosis not present

## 2024-04-01 DIAGNOSIS — M5414 Radiculopathy, thoracic region: Secondary | ICD-10-CM | POA: Diagnosis not present

## 2024-04-01 DIAGNOSIS — M9902 Segmental and somatic dysfunction of thoracic region: Secondary | ICD-10-CM | POA: Diagnosis not present

## 2024-04-01 DIAGNOSIS — M531 Cervicobrachial syndrome: Secondary | ICD-10-CM | POA: Diagnosis not present

## 2024-04-01 DIAGNOSIS — M9901 Segmental and somatic dysfunction of cervical region: Secondary | ICD-10-CM | POA: Diagnosis not present

## 2024-04-04 DIAGNOSIS — M5414 Radiculopathy, thoracic region: Secondary | ICD-10-CM | POA: Diagnosis not present

## 2024-04-04 DIAGNOSIS — M9901 Segmental and somatic dysfunction of cervical region: Secondary | ICD-10-CM | POA: Diagnosis not present

## 2024-04-04 DIAGNOSIS — M531 Cervicobrachial syndrome: Secondary | ICD-10-CM | POA: Diagnosis not present

## 2024-04-04 DIAGNOSIS — M9902 Segmental and somatic dysfunction of thoracic region: Secondary | ICD-10-CM | POA: Diagnosis not present

## 2024-04-08 DIAGNOSIS — M9901 Segmental and somatic dysfunction of cervical region: Secondary | ICD-10-CM | POA: Diagnosis not present

## 2024-04-08 DIAGNOSIS — M9902 Segmental and somatic dysfunction of thoracic region: Secondary | ICD-10-CM | POA: Diagnosis not present

## 2024-04-08 DIAGNOSIS — M531 Cervicobrachial syndrome: Secondary | ICD-10-CM | POA: Diagnosis not present

## 2024-04-08 DIAGNOSIS — M5414 Radiculopathy, thoracic region: Secondary | ICD-10-CM | POA: Diagnosis not present

## 2024-04-10 DIAGNOSIS — M5414 Radiculopathy, thoracic region: Secondary | ICD-10-CM | POA: Diagnosis not present

## 2024-04-10 DIAGNOSIS — M9901 Segmental and somatic dysfunction of cervical region: Secondary | ICD-10-CM | POA: Diagnosis not present

## 2024-04-10 DIAGNOSIS — M9902 Segmental and somatic dysfunction of thoracic region: Secondary | ICD-10-CM | POA: Diagnosis not present

## 2024-04-10 DIAGNOSIS — M531 Cervicobrachial syndrome: Secondary | ICD-10-CM | POA: Diagnosis not present

## 2024-04-15 ENCOUNTER — Ambulatory Visit
Admission: RE | Admit: 2024-04-15 | Discharge: 2024-04-15 | Disposition: A | Discharge status: Home / Self Care | Source: Ambulatory Visit | Attending: Family Medicine | Admitting: Family Medicine

## 2024-04-15 VITALS — BP 149/70 | HR 78 | Temp 97.8°F | Resp 18

## 2024-04-15 DIAGNOSIS — J309 Allergic rhinitis, unspecified: Secondary | ICD-10-CM

## 2024-04-15 DIAGNOSIS — J019 Acute sinusitis, unspecified: Secondary | ICD-10-CM | POA: Diagnosis not present

## 2024-04-15 MED ORDER — CEFDINIR 300 MG PO CAPS: 300.0000 mg | ORAL_CAPSULE | Freq: Two times a day (BID) | ORAL | 0 refills | Status: AC

## 2024-04-15 NOTE — ED Triage Notes (Signed)
 Patient presents with Cough and nasal congestion x 3 days.  Patient reports cough is worse at night.  Patient has been using Theraflu, no relief.

## 2024-04-15 NOTE — ED Provider Notes (Addendum)
 [facility]  Note:  This document was prepared using Conservation officer, historic buildings and may include unintentional dictation errors.  MRN: [id number] DOB: 06-19-1951  Subjective:   Francisco Fleming is a 73 y.o. male presenting for 7-10 day history of sinus congestion, persistent and worsening cough over the past 3 days. No fever, ear pain, ear drainage, chest pain, shob, wheezing, n/v, abdominal pain, rashes. No asthma. Used to smoker 1/2ppd for 30+ years, quit ~1 year ago. Used to take Singulair  for his allergies. Stopped because it did not help. Does not take anything for allergies currently.   No current facility-administered medications for this encounter.  Current Outpatient Medications:    amLODipine (NORVASC) 2.5 MG tablet, Take 2.5 mg by mouth daily., Disp: , Rfl:    BD PEN NEEDLE NANO U/F 32G X 4 MM MISC, USE ONE PEN NEEDLE PER TRESIBA INJECTION DAILY, Disp: , Rfl:    Beclomethasone Dipropionate  (QNASL ) 80 MCG/ACT AERS, Place 2 sprays into the nose daily., Disp: 10.6 g, Rfl: 0   metFORMIN (GLUCOPHAGE) 1000 MG tablet, Take 1,000 mg by mouth 2 (two) times daily., Disp: , Rfl: 11   pioglitazone (ACTOS) 45 MG tablet, Take 22.5 mg by mouth daily., Disp: , Rfl: 11   rosuvastatin (CRESTOR) 10 MG tablet, Take 10 mg by mouth daily., Disp: , Rfl:    telmisartan (MICARDIS) 80 MG tablet, Take 80 mg by mouth daily., Disp: , Rfl:    TRESIBA FLEXTOUCH 100 UNIT/ML SOPN FlexTouch Pen, Inject 22 Units into the skin daily., Disp: , Rfl:    acetaminophen  (TYLENOL ) 500 MG tablet, Take 500 mg by mouth every 6 (six) hours as needed., Disp: , Rfl:    latanoprost (XALATAN) 0.005 % ophthalmic solution, Place 1 drop into both eyes at bedtime., Disp: , Rfl:    mometasone  (NASONEX ) 50 MCG/ACT nasal spray, Place 2 sprays into the nose daily., Disp: 1 each, Rfl: 0   montelukast  (SINGULAIR ) 10 MG tablet, Take 1 tablet (10 mg total) by mouth daily., Disp: 30 tablet, Rfl: 2   Allergies   Allergen Reactions   Penicillins Hives and Rash    Has patient had a PCN reaction causing immediate rash, facial/tongue/throat swelling, SOB or lightheadedness with hypotension:No Has patient had a PCN reaction causing severe rash involving mucus membranes or skin necrosis:No Has patient had a PCN reaction that required hospitalization:No Has patient had a PCN reaction occurring within the last 10 years:No If all of the above answers are NO, then may proceed with Cephalosporin use.     Past Medical History:  Diagnosis Date   History of adenomatous polyp of colon    History of COVID-19 06/18/2021   08-16-2021 pt stated had mild symptoms that resolved but then had sinus infection and the flu with residual dry cough   History of eye injury    per pt right eye trauma from hit with baseball which caused increased eye pressure , uses eye drop   History of kidney stones 2021   Hyperplasia of prostate with lower urinary tract symptoms (LUTS)    Hypertension    Incomplete right bundle branch block (RBBB)    Prostate cancer ([facility]) 07/2020   urologist--- dr gay;  dx 11/ 2021 Gleason 3+3 active survillence;  biopsy 09/ 2022 Gleason 3+4   Type 2 diabetes mellitus ([facility])    followed  by pcp  (08-16-2021 pt stated check blood sugar once weekly fasting but did not know average)   Wears glasses  Past Surgical History:  Procedure Laterality Date   CATARACT EXTRACTION W/ INTRAOCULAR LENS IMPLANT Right 2016   COLONOSCOPY  07/2019   by dr abran   CYSTOSCOPY  08/22/2021   Procedure: CYSTOSCOPY;  Surgeon: Selma Donnice SAUNDERS, MD;  Location: [facility];  Service: Urology;;   LUMBAR LAMINECTOMY     1970s;    L3--L4   NASAL SINUS SURGERY     2002 approx   RADIOACTIVE SEED IMPLANT N/A 08/22/2021   Procedure: RADIOACTIVE SEED IMPLANT/BRACHYTHERAPY IMPLANT;  Surgeon: Selma Donnice SAUNDERS, MD;  Location: [facility];  Service: Urology;  Laterality: N/A;  66 seeds implanted    SHOULDER ARTHROSCOPY WITH ROTATOR CUFF REPAIR AND OPEN BICEPS TENODESIS Left 09/2018   SPACE OAR INSTILLATION N/A 08/22/2021   Procedure: SPACE OAR INSTILLATION;  Surgeon: Selma Donnice SAUNDERS, MD;  Location: [facility];  Service: Urology;  Laterality: N/A;   TONSILLECTOMY     child    Family History  Problem Relation Age of Onset   Colon polyps Mother    Cervical cancer Mother    Lung cancer Brother        non smoker. 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   Esophageal cancer Neg Hx    Rectal cancer Neg Hx    Stomach cancer Neg Hx    Breast cancer Neg Hx    Pancreatic cancer Neg Hx    Prostate cancer Neg Hx    Colon cancer Neg Hx     Social History   Tobacco Use   Smoking status: Some Days    Types: Cigars   Smokeless tobacco: Former    Types: Chew    Quit date: 1989   Tobacco comments:    smokes cigars occasionally when playing golf  Vaping Use   Vaping status: Never Used  Substance Use Topics   Alcohol  use: No    Alcohol /week: 0.0 standard drinks of alcohol    Drug use: Never    ROS   Objective:   Vitals: BP (!) 149/70 (BP Location: Left Arm)   Pulse 78   Temp 97.8 F (36.6 C) (Oral)   Resp 18   SpO2 96%   Physical Exam Constitutional:      General: He is not in acute distress.    Appearance: Normal appearance. He is well-developed and normal weight. He is not ill-appearing, toxic-appearing or diaphoretic.  HENT:     Head: Normocephalic and atraumatic.     Right Ear: Tympanic membrane, ear canal and external ear normal. No drainage, swelling or tenderness. No middle ear effusion. There is no impacted cerumen. Tympanic membrane is not erythematous or bulging.     Left Ear: Tympanic membrane, ear canal and external ear normal. No drainage, swelling or tenderness.  No middle ear effusion. There is no impacted cerumen. Tympanic membrane is not erythematous or bulging.     Nose: Nose normal. No congestion or rhinorrhea.     Mouth/Throat:      Mouth: Mucous membranes are moist.     Pharynx: No oropharyngeal exudate or posterior oropharyngeal erythema.  Eyes:     General: No scleral icterus.       Right eye: No discharge.        Left eye: No discharge.     Extraocular Movements: Extraocular movements intact.     Conjunctiva/sclera: Conjunctivae normal.  Cardiovascular:     Rate and Rhythm: Normal rate and regular rhythm.     Heart sounds: Normal heart  sounds. No murmur heard.    No friction rub. No gallop.  Pulmonary:     Effort: Pulmonary effort is normal. No respiratory distress.     Breath sounds: Normal breath sounds. No stridor. No wheezing, rhonchi or rales.  Musculoskeletal:     Cervical back: Normal range of motion and neck supple. No rigidity. No muscular tenderness.  Neurological:     General: No focal deficit present.     Mental Status: He is alert and oriented to person, place, and time.  Psychiatric:        Mood and Affect: Mood normal.        Behavior: Behavior normal.        Thought Content: Thought content normal.     Assessment and Plan :   PDMP not reviewed this encounter.  1. Acute non-recurrent sinusitis, unspecified location   2. Allergic rhinitis, unspecified seasonality, unspecified trigger     Initially patient reported symptoms started 3 days ago. However, reported history of sinus infections and then reported symptoms started 7-10 days ago. Discussed antibiotic stewardship. Will honor the second onset of symptoms that he reported for this visit. Possible viral etiology, allergic rhinitis as he is not taking anything for this currently. Creatinine Clearance is 24mL/min and therefore will use cefdinir . Recommend supportive care otherwise. Follow up with PCP. Deferred imaging given clear cardiopulmonary exam, hemodynamically stable vital signs.    Christopher Savannah, PA-C 04/15/24 1342  At discharge, patient requested a cough medication. I recommended otc cough medication and supportive measures.     Christopher Savannah, [place] 04/15/24 1345

## 2024-04-15 NOTE — Discharge Instructions (Signed)
 Start cefdinir  for your sinus infection. Follow up with your PCP if you continue to have symptoms as this is a sign that it is more than likely a viral infection. Push fluids, use Tylenol  for aches and pains.

## 2024-04-15 NOTE — ED Triage Notes (Signed)
 Denies any fever or diarrhea.

## 2024-04-23 DIAGNOSIS — M48061 Spinal stenosis, lumbar region without neurogenic claudication: Secondary | ICD-10-CM | POA: Diagnosis not present

## 2024-04-23 DIAGNOSIS — M48062 Spinal stenosis, lumbar region with neurogenic claudication: Secondary | ICD-10-CM | POA: Diagnosis not present

## 2024-05-22 DIAGNOSIS — R059 Cough, unspecified: Secondary | ICD-10-CM | POA: Diagnosis not present

## 2024-05-22 DIAGNOSIS — J3489 Other specified disorders of nose and nasal sinuses: Secondary | ICD-10-CM | POA: Diagnosis not present

## 2024-05-22 DIAGNOSIS — R0982 Postnasal drip: Secondary | ICD-10-CM | POA: Diagnosis not present

## 2024-05-22 DIAGNOSIS — R0981 Nasal congestion: Secondary | ICD-10-CM | POA: Diagnosis not present

## 2024-05-30 DIAGNOSIS — M48062 Spinal stenosis, lumbar region with neurogenic claudication: Secondary | ICD-10-CM | POA: Diagnosis not present

## 2024-06-26 NOTE — Pre-Procedure Instructions (Signed)
 Surgical Instructions   Your procedure is scheduled on July 07, 2024. Report to [facility] Main Entrance A at 6:00 A.M., then check in with the Admitting office. Any questions or running late day of surgery: call [phone]  Questions prior to your surgery date: call [phone], Monday-Friday, 8am-4pm. If you experience any cold or flu symptoms such as cough, fever, chills, shortness of breath, etc. between now and your scheduled surgery, please notify us  at the above number.     Remember:  Do not eat or drink after midnight the night before your surgery    Take these medicines the morning of surgery with A SIP OF WATER: amLODipine (NORVASC)  rosuvastatin (CRESTOR)    May take these medicines IF NEEDED: acetaminophen  (TYLENOL )    One week prior to surgery, STOP taking any Aspirin (unless otherwise instructed by your surgeon) Aleve, Naproxen, Ibuprofen, Motrin, Advil, Goody's, BC's, all herbal medications, fish oil, and non-prescription vitamins.   WHAT DO I DO ABOUT MY DIABETES MEDICATION?   Do not take metFORMIN (GLUCOPHAGE) or pioglitazone (ACTOS) the morning of surgery.  THE NIGHT BEFORE SURGERY, take 11 units of TRESIBA FLEXTOUCH insulin.       HOW TO MANAGE YOUR DIABETES BEFORE AND AFTER SURGERY  Why is it important to control my blood sugar before and after surgery? Improving blood sugar levels before and after surgery helps healing and can limit problems. A way of improving blood sugar control is eating a healthy diet by:  Eating less sugar and carbohydrates  Increasing activity/exercise  Talking with your doctor about reaching your blood sugar goals High blood sugars (greater than 180 mg/dL) can raise your risk of infections and slow your recovery, so you will need to focus on controlling your diabetes during the weeks before surgery. Make sure that the doctor who takes care of your diabetes knows about your planned surgery including the date and  location.  How do I manage my blood sugar before surgery? Check your blood sugar at least 4 times a day, starting 2 days before surgery, to make sure that the level is not too high or low.  Check your blood sugar the morning of your surgery when you wake up and every 2 hours until you get to the Short Stay unit.  If your blood sugar is less than 70 mg/dL, you will need to treat for low blood sugar: Do not take insulin. Treat a low blood sugar (less than 70 mg/dL) with  cup of clear juice (cranberry or apple), 4 glucose tablets, OR glucose gel. Recheck blood sugar in 15 minutes after treatment (to make sure it is greater than 70 mg/dL). If your blood sugar is not greater than 70 mg/dL on recheck, call [phone] for further instructions. Report your blood sugar to the short stay nurse when you get to Short Stay.  If you are admitted to the hospital after surgery: Your blood sugar will be checked by the staff and you will probably be given insulin after surgery (instead of oral diabetes medicines) to make sure you have good blood sugar levels. The goal for blood sugar control after surgery is 80-180 mg/dL.                      Do NOT Smoke (Tobacco/Vaping) for 24 hours prior to your procedure.  If you use a CPAP at night, you may bring your mask/headgear for your overnight stay.   You will be asked to remove any contacts, glasses,  piercing's, hearing aid's, dentures/partials prior to surgery. Please bring cases for these items if needed.    Patients discharged the day of surgery will not be allowed to drive home, and someone needs to stay with them for 24 hours.  SURGICAL WAITING ROOM VISITATION Patients may have no more than 2 support people in the waiting area - these visitors may rotate.   Pre-op nurse will coordinate an appropriate time for 1 ADULT support person, who may not rotate, to accompany patient in pre-op.  Children under the age of 102 must have an adult with them who is  not the patient and must remain in the main waiting area with an adult.  If the patient needs to stay at the hospital during part of their recovery, the visitor guidelines for inpatient rooms apply.  Please refer to the [facility] website for the visitor guidelines for any additional information.   If you received a COVID test during your pre-op visit  it is requested that you wear a mask when out in public, stay away from anyone that may not be feeling well and notify your surgeon if you develop symptoms. If you have been in contact with anyone that has tested positive in the last 10 days please notify you surgeon.      Pre-operative 4 CHG Bathing Instructions   You can play a key role in reducing the risk of infection after surgery. Your skin needs to be as free of germs as possible. You can reduce the number of germs on your skin by washing with CHG (chlorhexidine gluconate) soap before surgery. CHG is an antiseptic soap that kills germs and continues to kill germs even after washing.   DO NOT use if you have an allergy to chlorhexidine/CHG or antibacterial soaps. If your skin becomes reddened or irritated, stop using the CHG and notify one of our RNs at [phone].   Please shower with the CHG soap starting 4 days before surgery using the following schedule:     Please keep in mind the following:  DO NOT shave, including legs and underarms, starting the day of your first shower.   You may shave your face at any point before/day of surgery.  Place clean sheets on your bed the day you start using CHG soap. Use a clean washcloth (not used since being washed) for each shower. DO NOT sleep with pets once you start using the CHG.   CHG Shower Instructions:  Wash your face and private area with normal soap. If you choose to wash your hair, wash first with your normal shampoo.  After you use shampoo/soap, rinse your hair and body thoroughly to remove shampoo/soap residue.  Turn the  water OFF and apply  bottle of CHG soap to a CLEAN washcloth.  Apply CHG soap ONLY FROM YOUR NECK DOWN TO YOUR TOES (washing for 3-5 minutes)  DO NOT use CHG soap on face, private areas, open wounds, or sores.  Pay special attention to the area where your surgery is being performed.  If you are having back surgery, having someone wash your back for you may be helpful. Wait 2 minutes after CHG soap is applied, then you may rinse off the CHG soap.  Pat dry with a clean towel  Put on clean clothes/pajamas   If you choose to wear lotion, please use ONLY the CHG-compatible lotions that are listed below.  Additional instructions for the day of surgery:  If you choose, you may shower the morning  of surgery with an antibacterial soap.  DO NOT APPLY any lotions, deodorants, cologne, or perfumes.   Do not bring valuables to the hospital. [facility] is not responsible for any belongings/valuables. Do not wear nail polish, gel polish, artificial nails, or any other type of covering on natural nails (fingers and toes) Do not wear jewelry or makeup Put on clean/comfortable clothes.  Please brush your teeth.  Ask your nurse before applying any prescription medications to the skin.     CHG Compatible Lotions   Aveeno Moisturizing lotion  Cetaphil Moisturizing Cream  Cetaphil Moisturizing Lotion  Clairol Herbal Essence Moisturizing Lotion, Dry Skin  Clairol Herbal Essence Moisturizing Lotion, Extra Dry Skin  Clairol Herbal Essence Moisturizing Lotion, Normal Skin  Curel Age Defying Therapeutic Moisturizing Lotion with Alpha Hydroxy  Curel Extreme Care Body Lotion  Curel Soothing Hands Moisturizing Hand Lotion  Curel Therapeutic Moisturizing Cream, Fragrance-Free  Curel Therapeutic Moisturizing Lotion, Fragrance-Free  Curel Therapeutic Moisturizing Lotion, Original Formula  Eucerin Daily Replenishing Lotion  Eucerin Dry Skin Therapy Plus Alpha Hydroxy Crme  Eucerin Dry Skin Therapy Plus  Alpha Hydroxy Lotion  Eucerin Original Crme  Eucerin Original Lotion  Eucerin Plus Crme Eucerin Plus Lotion  Eucerin TriLipid Replenishing Lotion  Keri Anti-Bacterial Hand Lotion  Keri Deep Conditioning Original Lotion Dry Skin Formula Softly Scented  Keri Deep Conditioning Original Lotion, Fragrance Free Sensitive Skin Formula  Keri Lotion Fast Absorbing Fragrance Free Sensitive Skin Formula  Keri Lotion Fast Absorbing Softly Scented Dry Skin Formula  Keri Original Lotion  Keri Skin Renewal Lotion Keri Silky Smooth Lotion  Keri Silky Smooth Sensitive Skin Lotion  Nivea Body Creamy Conditioning Oil  Nivea Body Extra Enriched Lotion  Nivea Body Original Lotion  Nivea Body Sheer Moisturizing Lotion Nivea Crme  Nivea Skin Firming Lotion  NutraDerm 30 Skin Lotion  NutraDerm Skin Lotion  NutraDerm Therapeutic Skin Cream  NutraDerm Therapeutic Skin Lotion  ProShield Protective Hand Cream  Provon moisturizing lotion  Please read over the following fact sheets that you were given.

## 2024-06-27 ENCOUNTER — Encounter (HOSPITAL_COMMUNITY): Payer: Self-pay

## 2024-06-27 ENCOUNTER — Encounter (HOSPITAL_COMMUNITY)
Admission: RE | Admit: 2024-06-27 | Discharge: 2024-06-27 | Disposition: A | Discharge status: Home / Self Care | Source: Ambulatory Visit | Attending: Neurosurgery | Admitting: Neurosurgery

## 2024-06-27 ENCOUNTER — Other Ambulatory Visit: Payer: Self-pay

## 2024-06-27 VITALS — BP 144/55 | HR 59 | Temp 98.2°F | Resp 17 | Ht 69.5 in | Wt 169.0 lb

## 2024-06-27 DIAGNOSIS — R001 Bradycardia, unspecified: Secondary | ICD-10-CM | POA: Diagnosis not present

## 2024-06-27 DIAGNOSIS — Z01818 Encounter for other preprocedural examination: Secondary | ICD-10-CM | POA: Insufficient documentation

## 2024-06-27 DIAGNOSIS — I498 Other specified cardiac arrhythmias: Secondary | ICD-10-CM | POA: Insufficient documentation

## 2024-06-27 DIAGNOSIS — E119 Type 2 diabetes mellitus without complications: Secondary | ICD-10-CM | POA: Insufficient documentation

## 2024-06-27 DIAGNOSIS — Z794 Long term (current) use of insulin: Secondary | ICD-10-CM | POA: Diagnosis not present

## 2024-06-27 LAB — BASIC METABOLIC PANEL WITH GFR
Anion gap: 11 (ref 5–15)
BUN: 20 mg/dL (ref 8–23)
CO2: 25 mmol/L (ref 22–32)
Calcium: 9.4 mg/dL (ref 8.9–10.3)
Chloride: 104 mmol/L (ref 98–111)
Creatinine, Ser: 0.93 mg/dL (ref 0.61–1.24)
GFR, Estimated: >60 mL/min (ref >60)
Glucose, Bld: 134 mg/dL — ABNORMAL HIGH (ref 70–99)
Potassium: 5 mmol/L (ref 3.5–5.1)
Sodium: 140 mmol/L (ref 135–145)

## 2024-06-27 LAB — CBC
HCT: 47.7 % (ref 39.0–52.0)
Hemoglobin: 15.6 g/dL (ref 13.0–17.0)
MCH: 32.5 pg (ref 26.0–34.0)
MCHC: 32.7 g/dL (ref 30.0–36.0)
MCV: 99.4 fL (ref 80.0–100.0)
Platelets: 207 K/uL (ref 150–400)
RBC: 4.8 MIL/uL (ref 4.22–5.81)
RDW: 12.8 % (ref 11.5–15.5)
WBC: 7.3 K/uL (ref 4.0–10.5)
nRBC: 0 % (ref 0.0–0.2)

## 2024-06-27 LAB — TYPE AND SCREEN
ABO/RH(D): AB POS
Antibody Screen: NEGATIVE

## 2024-06-27 LAB — SURGICAL PCR SCREEN
MRSA, PCR: NEGATIVE
Staphylococcus aureus: NEGATIVE

## 2024-06-27 LAB — GLUCOSE, CAPILLARY: Glucose-Capillary: 142 mg/dL — ABNORMAL HIGH (ref 70–99)

## 2024-06-27 LAB — HEMOGLOBIN A1C
Hgb A1c MFr Bld: 6.5 % — ABNORMAL HIGH (ref 4.8–5.6)
Mean Plasma Glucose: 139.85 mg/dL

## 2024-06-27 NOTE — Progress Notes (Signed)
 PCP - Dr. Searcy Overcast Cardiologist - denies  PPM/ICD - denies Device Orders - na Rep Notified - na  Chest x-ray - na EKG - PAT, 06/27/2024 Stress Test -  ECHO -  Cardiac Cath -   Sleep Study - denies CPAP - na  Type II diabetic. A1C drawn during PAT.  Blood sugar 142  at PAT, CGM, Libre 7 to left arm Fasting Blood Sugar:  50-90 Checks Blood Sugar continuously Metformin, hold DOS Tresiba, use 11 units the night before surgery, which is 50% of your regular dose  Last dose of GLP1 agonist-  denies GLP1 instructions: na  Blood Thinner Instructions: denies Aspirin Instructions:denies  ERAS Protcol - NPO  Anesthesia review: Ye.  HTN, RBBB, DM  Patient denies shortness of breath, fever, cough and chest pain at PAT appointment   All instructions explained to the patient, with a verbal understanding of the material. Patient agrees to go over the instructions while at home for a better understanding. Patient also instructed to self quarantine after being tested for COVID-19. The opportunity to ask questions was provided.

## 2024-07-01 DIAGNOSIS — M1990 Unspecified osteoarthritis, unspecified site: Secondary | ICD-10-CM | POA: Diagnosis not present

## 2024-07-01 DIAGNOSIS — I10 Essential (primary) hypertension: Secondary | ICD-10-CM | POA: Diagnosis not present

## 2024-07-01 DIAGNOSIS — Z23 Encounter for immunization: Secondary | ICD-10-CM | POA: Diagnosis not present

## 2024-07-01 DIAGNOSIS — E785 Hyperlipidemia, unspecified: Secondary | ICD-10-CM | POA: Diagnosis not present

## 2024-07-01 DIAGNOSIS — H4030X0 Glaucoma secondary to eye trauma, unspecified eye, stage unspecified: Secondary | ICD-10-CM | POA: Diagnosis not present

## 2024-07-01 DIAGNOSIS — D692 Other nonthrombocytopenic purpura: Secondary | ICD-10-CM | POA: Diagnosis not present

## 2024-07-01 DIAGNOSIS — M5441 Lumbago with sciatica, right side: Secondary | ICD-10-CM | POA: Diagnosis not present

## 2024-07-01 DIAGNOSIS — M5442 Lumbago with sciatica, left side: Secondary | ICD-10-CM | POA: Diagnosis not present

## 2024-07-01 DIAGNOSIS — D126 Benign neoplasm of colon, unspecified: Secondary | ICD-10-CM | POA: Diagnosis not present

## 2024-07-01 DIAGNOSIS — E1169 Type 2 diabetes mellitus with other specified complication: Secondary | ICD-10-CM | POA: Diagnosis not present

## 2024-07-01 DIAGNOSIS — C61 Malignant neoplasm of prostate: Secondary | ICD-10-CM | POA: Diagnosis not present

## 2024-07-07 ENCOUNTER — Encounter (HOSPITAL_COMMUNITY): Admission: RE | Disposition: A | Payer: Self-pay | Source: Home / Self Care | Attending: Neurosurgery

## 2024-07-07 ENCOUNTER — Encounter (HOSPITAL_COMMUNITY): Payer: Self-pay | Admitting: Neurosurgery

## 2024-07-07 ENCOUNTER — Ambulatory Visit (HOSPITAL_COMMUNITY)

## 2024-07-07 ENCOUNTER — Observation Stay (HOSPITAL_COMMUNITY)
Admission: RE | Admit: 2024-07-07 | Discharge: 2024-07-08 | Disposition: A | Payer: Self-pay | Attending: Neurosurgery | Admitting: Neurosurgery

## 2024-07-07 ENCOUNTER — Ambulatory Visit (HOSPITAL_COMMUNITY): Admitting: Certified Registered Nurse Anesthetist

## 2024-07-07 ENCOUNTER — Ambulatory Visit (HOSPITAL_COMMUNITY): Payer: Self-pay | Admitting: Physician Assistant

## 2024-07-07 DIAGNOSIS — Z794 Long term (current) use of insulin: Secondary | ICD-10-CM | POA: Diagnosis not present

## 2024-07-07 DIAGNOSIS — Z8616 Personal history of COVID-19: Secondary | ICD-10-CM | POA: Diagnosis not present

## 2024-07-07 DIAGNOSIS — M5440 Lumbago with sciatica, unspecified side: Secondary | ICD-10-CM | POA: Diagnosis present

## 2024-07-07 DIAGNOSIS — Z7984 Long term (current) use of oral hypoglycemic drugs: Secondary | ICD-10-CM | POA: Insufficient documentation

## 2024-07-07 DIAGNOSIS — I10 Essential (primary) hypertension: Secondary | ICD-10-CM | POA: Diagnosis not present

## 2024-07-07 DIAGNOSIS — M5116 Intervertebral disc disorders with radiculopathy, lumbar region: Secondary | ICD-10-CM | POA: Diagnosis not present

## 2024-07-07 DIAGNOSIS — E119 Type 2 diabetes mellitus without complications: Secondary | ICD-10-CM | POA: Diagnosis not present

## 2024-07-07 DIAGNOSIS — M48062 Spinal stenosis, lumbar region with neurogenic claudication: Secondary | ICD-10-CM | POA: Diagnosis not present

## 2024-07-07 DIAGNOSIS — Z79899 Other long term (current) drug therapy: Secondary | ICD-10-CM | POA: Insufficient documentation

## 2024-07-07 DIAGNOSIS — M4310 Spondylolisthesis, site unspecified: Principal | ICD-10-CM | POA: Diagnosis present

## 2024-07-07 DIAGNOSIS — Z87891 Personal history of nicotine dependence: Secondary | ICD-10-CM | POA: Diagnosis not present

## 2024-07-07 DIAGNOSIS — M4316 Spondylolisthesis, lumbar region: Secondary | ICD-10-CM | POA: Diagnosis not present

## 2024-07-07 DIAGNOSIS — Z8546 Personal history of malignant neoplasm of prostate: Secondary | ICD-10-CM | POA: Diagnosis not present

## 2024-07-07 LAB — GLUCOSE, CAPILLARY
Glucose-Capillary: 174 mg/dL — ABNORMAL HIGH (ref 70–99)
Glucose-Capillary: 173 mg/dL — ABNORMAL HIGH (ref 70–99)
Glucose-Capillary: 216 mg/dL — ABNORMAL HIGH (ref 70–99)
Glucose-Capillary: 165 mg/dL — ABNORMAL HIGH (ref 70–99)
Glucose-Capillary: 133 mg/dL — ABNORMAL HIGH (ref 70–99)

## 2024-07-07 LAB — ABO/RH: ABO/RH(D): AB POS

## 2024-07-07 SURGERY — POSTERIOR LUMBAR FUSION 1 LEVEL
Anesthesia: General | Site: Back

## 2024-07-07 MED ORDER — CHLORHEXIDINE GLUCONATE CLOTH 2 % EX PADS: 6.0000 | MEDICATED_PAD | Freq: Once | CUTANEOUS | Status: DC

## 2024-07-07 MED ORDER — DEXAMETHASONE SOD PHOSPHATE PF 10 MG/ML IJ SOLN
INTRAMUSCULAR | Status: DC | PRN
Start: 2024-07-07 — End: 2024-07-07
  Administered 2024-07-07: 5 mg via INTRAVENOUS

## 2024-07-07 MED ORDER — FENTANYL CITRATE (PF) 100 MCG/2ML IJ SOLN
25.0000 ug | INTRAMUSCULAR | Status: DC | PRN
  Administered 2024-07-07 (×2): 50 ug via INTRAVENOUS

## 2024-07-07 MED ORDER — DIAZEPAM 5 MG PO TABS
5.0000 mg | ORAL_TABLET | Freq: Four times a day (QID) | ORAL | Status: DC | PRN
  Administered 2024-07-07: 5 mg via ORAL
  Administered 2024-07-07: 10 mg via ORAL
  Administered 2024-07-08 (×2): 5 mg via ORAL
  Filled 2024-07-07 (×2): qty 1
  Filled 2024-07-07: qty 2
  Filled 2024-07-07: qty 1

## 2024-07-07 MED ORDER — FENTANYL CITRATE (PF) 100 MCG/2ML IJ SOLN
INTRAMUSCULAR | Status: DC | PRN
  Administered 2024-07-07: 100 ug via INTRAVENOUS

## 2024-07-07 MED ORDER — PHENOL 1.4 % MT LIQD: 1.0000 | OROMUCOSAL | Status: DC | PRN

## 2024-07-07 MED ORDER — THROMBIN 20000 UNITS EX SOLR
CUTANEOUS | Status: DC | PRN
  Administered 2024-07-07: 20 mL via TOPICAL

## 2024-07-07 MED ORDER — ROCURONIUM BROMIDE 10 MG/ML (PF) SYRINGE
PREFILLED_SYRINGE | INTRAVENOUS | Status: AC
  Filled 2024-07-07: qty 10

## 2024-07-07 MED ORDER — FENTANYL CITRATE (PF) 100 MCG/2ML IJ SOLN
INTRAMUSCULAR | Status: AC
  Filled 2024-07-07: qty 2

## 2024-07-07 MED ORDER — ACETAMINOPHEN 10 MG/ML IV SOLN: 1000.0000 mg | Freq: Once | INTRAVENOUS | Status: DC | PRN

## 2024-07-07 MED ORDER — OXYCODONE HCL 5 MG/5ML PO SOLN: 5.0000 mg | Freq: Once | ORAL | Status: DC | PRN

## 2024-07-07 MED ORDER — SUGAMMADEX SODIUM 200 MG/2ML IV SOLN
INTRAVENOUS | Status: DC | PRN
  Administered 2024-07-07: 400 mg via INTRAVENOUS

## 2024-07-07 MED ORDER — MIDAZOLAM HCL (PF) 2 MG/2ML IJ SOLN
INTRAMUSCULAR | Status: DC | PRN
  Administered 2024-07-07: 2 mg via INTRAVENOUS

## 2024-07-07 MED ORDER — BISACODYL 10 MG RE SUPP: 10.0000 mg | Freq: Every day | RECTAL | Status: DC | PRN

## 2024-07-07 MED ORDER — SODIUM CHLORIDE 0.9 % IV SOLN
250.0000 mL | INTRAVENOUS | Status: AC
  Administered 2024-07-07: 250 mL via INTRAVENOUS

## 2024-07-07 MED ORDER — ONDANSETRON HCL 4 MG PO TABS: 4.0000 mg | ORAL_TABLET | Freq: Four times a day (QID) | ORAL | Status: DC | PRN

## 2024-07-07 MED ORDER — ACETAMINOPHEN 325 MG PO TABS: 650.0000 mg | ORAL_TABLET | ORAL | Status: DC | PRN

## 2024-07-07 MED ORDER — ALBUMIN HUMAN 5 % IV SOLN
INTRAVENOUS | Status: DC | PRN
Start: 2024-07-07 — End: 2024-07-07

## 2024-07-07 MED ORDER — BUPIVACAINE HCL (PF) 0.25 % IJ SOLN
INTRAMUSCULAR | Status: AC
  Filled 2024-07-07: qty 30

## 2024-07-07 MED ORDER — EPHEDRINE SULFATE-NACL 50-0.9 MG/10ML-% IV SOSY
PREFILLED_SYRINGE | INTRAVENOUS | Status: DC | PRN
Start: 2024-07-07 — End: 2024-07-07
  Administered 2024-07-07: 10 mg via INTRAVENOUS

## 2024-07-07 MED ORDER — METFORMIN HCL 500 MG PO TABS
1000.0000 mg | ORAL_TABLET | Freq: Two times a day (BID) | ORAL | Status: DC
  Administered 2024-07-07 – 2024-07-08 (×2): 1000 mg via ORAL
  Filled 2024-07-07 (×2): qty 2

## 2024-07-07 MED ORDER — 0.9 % SODIUM CHLORIDE (POUR BTL) OPTIME
TOPICAL | Status: DC | PRN
  Administered 2024-07-07: 1000 mL

## 2024-07-07 MED ORDER — IRBESARTAN 300 MG PO TABS
300.0000 mg | ORAL_TABLET | Freq: Every day | ORAL | Status: DC
  Administered 2024-07-07 – 2024-07-08 (×2): 300 mg via ORAL
  Filled 2024-07-07 (×2): qty 1

## 2024-07-07 MED ORDER — ORAL CARE MOUTH RINSE: 15.0000 mL | Freq: Once | OROMUCOSAL | Status: AC

## 2024-07-07 MED ORDER — LIDOCAINE 2% (20 MG/ML) 5 ML SYRINGE
INTRAMUSCULAR | Status: AC
  Filled 2024-07-07: qty 5

## 2024-07-07 MED ORDER — CEFAZOLIN SODIUM-DEXTROSE 1-4 GM/50ML-% IV SOLN
1.0000 g | Freq: Three times a day (TID) | INTRAVENOUS | Status: AC
  Administered 2024-07-07 (×2): 1 g via INTRAVENOUS
  Filled 2024-07-07 (×2): qty 50

## 2024-07-07 MED ORDER — VANCOMYCIN HCL 1000 MG IV SOLR
INTRAVENOUS | Status: AC
Start: 2024-07-07 — End: 2024-07-07
  Filled 2024-07-07: qty 20

## 2024-07-07 MED ORDER — HYDROMORPHONE HCL 1 MG/ML IJ SOLN
1.0000 mg | INTRAMUSCULAR | Status: DC | PRN
  Administered 2024-07-07: 1 mg via INTRAVENOUS
  Filled 2024-07-07: qty 1

## 2024-07-07 MED ORDER — LATANOPROST 0.005 % OP SOLN
1.0000 [drp] | Freq: Every day | OPHTHALMIC | Status: DC
Start: 2024-07-07 — End: 2024-07-08
  Administered 2024-07-07: 1 [drp] via OPHTHALMIC
  Filled 2024-07-07: qty 2.5

## 2024-07-07 MED ORDER — ONDANSETRON HCL 4 MG/2ML IJ SOLN
INTRAMUSCULAR | Status: DC | PRN
  Administered 2024-07-07: 4 mg via INTRAVENOUS

## 2024-07-07 MED ORDER — OXYCODONE HCL 5 MG PO TABS: 5.0000 mg | ORAL_TABLET | Freq: Once | ORAL | Status: DC | PRN

## 2024-07-07 MED ORDER — PROPOFOL 10 MG/ML IV BOLUS
INTRAVENOUS | Status: DC | PRN
  Administered 2024-07-07: 120 mg via INTRAVENOUS

## 2024-07-07 MED ORDER — MENTHOL 3 MG MT LOZG: 1.0000 | LOZENGE | OROMUCOSAL | Status: DC | PRN

## 2024-07-07 MED ORDER — ACETAMINOPHEN 650 MG RE SUPP: 650.0000 mg | RECTAL | Status: DC | PRN

## 2024-07-07 MED ORDER — MIDAZOLAM HCL 2 MG/2ML IJ SOLN
INTRAMUSCULAR | Status: AC
  Filled 2024-07-07: qty 2

## 2024-07-07 MED ORDER — VANCOMYCIN HCL 1000 MG IV SOLR
INTRAVENOUS | Status: DC | PRN
  Administered 2024-07-07: 1000 mg

## 2024-07-07 MED ORDER — FLEET ENEMA RE ENEM: 1.0000 | ENEMA | Freq: Once | RECTAL | Status: DC | PRN

## 2024-07-07 MED ORDER — CEFAZOLIN SODIUM-DEXTROSE 2-4 GM/100ML-% IV SOLN
2.0000 g | INTRAVENOUS | Status: AC
  Administered 2024-07-07: 2 g via INTRAVENOUS
  Filled 2024-07-07: qty 100

## 2024-07-07 MED ORDER — BUPIVACAINE HCL (PF) 0.25 % IJ SOLN
INTRAMUSCULAR | Status: DC | PRN
  Administered 2024-07-07: 20 mL

## 2024-07-07 MED ORDER — DEXMEDETOMIDINE HCL IN NACL 80 MCG/20ML IV SOLN
INTRAVENOUS | Status: DC | PRN
  Administered 2024-07-07: 8 ug via INTRAVENOUS

## 2024-07-07 MED ORDER — PHENYLEPHRINE HCL-NACL 20-0.9 MG/250ML-% IV SOLN
INTRAVENOUS | Status: DC | PRN
  Administered 2024-07-07: 20 ug/min via INTRAVENOUS

## 2024-07-07 MED ORDER — PHENYLEPHRINE 80 MCG/ML (10ML) SYRINGE FOR IV PUSH (FOR BLOOD PRESSURE SUPPORT)
PREFILLED_SYRINGE | INTRAVENOUS | Status: DC | PRN
  Administered 2024-07-07: 50 ug via INTRAVENOUS

## 2024-07-07 MED ORDER — HYDROCODONE-ACETAMINOPHEN 10-325 MG PO TABS
1.0000 | ORAL_TABLET | ORAL | Status: DC | PRN
  Administered 2024-07-07 – 2024-07-08 (×6): 1 via ORAL
  Filled 2024-07-07 (×6): qty 1

## 2024-07-07 MED ORDER — ROCURONIUM BROMIDE 10 MG/ML (PF) SYRINGE
PREFILLED_SYRINGE | INTRAVENOUS | Status: DC | PRN
  Administered 2024-07-07: 10 mg via INTRAVENOUS
  Administered 2024-07-07: 50 mg via INTRAVENOUS
  Administered 2024-07-07: 30 mg via INTRAVENOUS
  Administered 2024-07-07: 20 mg via INTRAVENOUS

## 2024-07-07 MED ORDER — INSULIN ASPART 100 UNIT/ML IJ SOLN
0.0000 [IU] | Freq: Three times a day (TID) | INTRAMUSCULAR | Status: DC
  Administered 2024-07-07: 5 [IU] via SUBCUTANEOUS
  Administered 2024-07-07 – 2024-07-08 (×2): 3 [IU] via SUBCUTANEOUS

## 2024-07-07 MED ORDER — ONDANSETRON HCL 4 MG/2ML IJ SOLN: 4.0000 mg | Freq: Four times a day (QID) | INTRAMUSCULAR | Status: DC | PRN

## 2024-07-07 MED ORDER — INSULIN ASPART 100 UNIT/ML IJ SOLN
0.0000 [IU] | INTRAMUSCULAR | Status: DC | PRN
  Administered 2024-07-07: 2 [IU] via SUBCUTANEOUS

## 2024-07-07 MED ORDER — LIDOCAINE 2% (20 MG/ML) 5 ML SYRINGE
INTRAMUSCULAR | Status: DC | PRN
  Administered 2024-07-07: 60 mg via INTRAVENOUS

## 2024-07-07 MED ORDER — PROPOFOL 10 MG/ML IV BOLUS
INTRAVENOUS | Status: AC
Start: 2024-07-07 — End: 2024-07-07
  Filled 2024-07-07: qty 20

## 2024-07-07 MED ORDER — LACTATED RINGERS IV SOLN: INTRAVENOUS | Status: DC

## 2024-07-07 MED ORDER — HYDROMORPHONE HCL 1 MG/ML IJ SOLN
INTRAMUSCULAR | Status: DC | PRN
  Administered 2024-07-07: .3 mg via INTRAVENOUS
  Administered 2024-07-07: .2 mg via INTRAVENOUS

## 2024-07-07 MED ORDER — EPHEDRINE 5 MG/ML INJ
INTRAVENOUS | Status: AC
  Filled 2024-07-07: qty 5

## 2024-07-07 MED ORDER — ROSUVASTATIN CALCIUM 20 MG PO TABS
20.0000 mg | ORAL_TABLET | Freq: Every day | ORAL | Status: DC
  Administered 2024-07-08: 20 mg via ORAL
  Filled 2024-07-07: qty 1

## 2024-07-07 MED ORDER — CHLORHEXIDINE GLUCONATE 0.12 % MT SOLN
15.0000 mL | Freq: Once | OROMUCOSAL | Status: AC
  Administered 2024-07-07: 15 mL via OROMUCOSAL
  Filled 2024-07-07: qty 15

## 2024-07-07 MED ORDER — AMLODIPINE BESYLATE 2.5 MG PO TABS
2.5000 mg | ORAL_TABLET | Freq: Every day | ORAL | Status: DC
  Administered 2024-07-08: 2.5 mg via ORAL
  Filled 2024-07-07: qty 1

## 2024-07-07 MED ORDER — SODIUM CHLORIDE 0.9% FLUSH
3.0000 mL | Freq: Two times a day (BID) | INTRAVENOUS | Status: DC
  Administered 2024-07-07 (×2): 3 mL via INTRAVENOUS

## 2024-07-07 MED ORDER — POLYETHYLENE GLYCOL 3350 17 G PO PACK: 17.0000 g | PACK | Freq: Every day | ORAL | Status: DC | PRN

## 2024-07-07 MED ORDER — FENTANYL CITRATE (PF) 250 MCG/5ML IJ SOLN: INTRAMUSCULAR | Status: DC | PRN

## 2024-07-07 MED ORDER — HYDROMORPHONE HCL 1 MG/ML IJ SOLN
INTRAMUSCULAR | Status: AC
  Filled 2024-07-07: qty 0.5

## 2024-07-07 MED ORDER — ONDANSETRON HCL 4 MG/2ML IJ SOLN
INTRAMUSCULAR | Status: AC
  Filled 2024-07-07: qty 2

## 2024-07-07 MED ORDER — SODIUM CHLORIDE 0.9% FLUSH: 3.0000 mL | INTRAVENOUS | Status: DC | PRN

## 2024-07-07 MED ORDER — THROMBIN 20000 UNITS EX SOLR
CUTANEOUS | Status: AC
  Filled 2024-07-07: qty 20000

## 2024-07-07 MED ORDER — PIOGLITAZONE HCL 15 MG PO TABS
22.5000 mg | ORAL_TABLET | Freq: Every day | ORAL | Status: DC
  Administered 2024-07-08: 22.5 mg via ORAL
  Filled 2024-07-07: qty 1.5

## 2024-07-07 SURGICAL SUPPLY — 47 items
BAG COUNTER SPONGE SURGICOUNT (BAG) ×1 IMPLANT
BENZOIN TINCTURE PRP APPL 2/3 (GAUZE/BANDAGES/DRESSINGS) ×1 IMPLANT
BLADE BONE MILL MEDIUM (MISCELLANEOUS) ×2 IMPLANT
BLADE CLIPPER SURG (BLADE) IMPLANT
BUR MATCHSTICK NEURO 3.0 LAGG (BURR) ×2 IMPLANT
CAGE EXP CATALYFT 9 (Plate) IMPLANT
CANISTER SUCTION 3000ML PPV (SUCTIONS) ×2 IMPLANT
CNTNR URN SCR LID CUP LEK RST (MISCELLANEOUS) ×1 IMPLANT
COVER BACK TABLE 60X90IN (DRAPES) ×2 IMPLANT
DERMABOND ADVANCED .7 DNX12 (GAUZE/BANDAGES/DRESSINGS) ×1 IMPLANT
DRAPE C-ARM 42X72 X-RAY (DRAPES) ×4 IMPLANT
DRAPE HALF SHEET 40X57 (DRAPES) IMPLANT
DRAPE LAPAROTOMY 100X72X124 (DRAPES) ×1 IMPLANT
DRAPE SURG 17X23 STRL (DRAPES) ×4 IMPLANT
DRSG OPSITE POSTOP 4X6 (GAUZE/BANDAGES/DRESSINGS) ×2 IMPLANT
DURAPREP 26ML APPLICATOR (WOUND CARE) ×1 IMPLANT
ELECTRODE REM PT RTRN 9FT ADLT (ELECTROSURGICAL) ×1 IMPLANT
EVACUATOR 1/8 PVC DRAIN (DRAIN) IMPLANT
GAUZE 4X4 16PLY ~~LOC~~+RFID DBL (SPONGE) IMPLANT
GAUZE SPONGE 4X4 12PLY STRL (GAUZE/BANDAGES/DRESSINGS) IMPLANT
GLOVE BIO SURGEON STRL SZ 6 (GLOVE) ×1 IMPLANT
GLOVE BIOGEL PI IND STRL 6.5 (GLOVE) ×1 IMPLANT
GLOVE ECLIPSE 9.0 STRL (GLOVE) ×4 IMPLANT
GOWN STRL REUS W/ TWL LRG LVL3 (GOWN DISPOSABLE) IMPLANT
GOWN STRL REUS W/ TWL XL LVL3 (GOWN DISPOSABLE) ×2 IMPLANT
GOWN STRL REUS W/TWL 2XL LVL3 (GOWN DISPOSABLE) IMPLANT
KIT BASIN OR (CUSTOM PROCEDURE TRAY) ×1 IMPLANT
KIT TURNOVER KIT B (KITS) ×2 IMPLANT
NDL HYPO 22X1.5 SAFETY MO (MISCELLANEOUS) ×1 IMPLANT
NEEDLE HYPO 22X1.5 SAFETY MO (MISCELLANEOUS) ×1 IMPLANT
PACK LAMINECTOMY NEURO (CUSTOM PROCEDURE TRAY) ×1 IMPLANT
PENCIL BUTTON HOLSTER BLD 10FT (ELECTRODE) ×1 IMPLANT
PUTTY GRAFTON DBF 6CC W/DELIVE (Putty) IMPLANT
ROD 40MM SOLERA PREBENT (Rod) IMPLANT
SCREW SOLERA 45X6.5XMA NS SPNE (Screw) IMPLANT
SET SCREW SPINE 5.5X6 TI (Screw) IMPLANT
SOLN 0.9% NACL POUR BTL 1000ML (IV SOLUTION) ×1 IMPLANT
SOLN STERILE WATER BTL 1000 ML (IV SOLUTION) ×1 IMPLANT
SPIKE FLUID TRANSFER (MISCELLANEOUS) ×2 IMPLANT
SPONGE SURGIFOAM ABS GEL 100 (HEMOSTASIS) ×1 IMPLANT
STRIP CLOSURE SKIN 1/2X4 (GAUZE/BANDAGES/DRESSINGS) ×2 IMPLANT
SUT VIC AB 0 CT1 18XCR BRD8 (SUTURE) ×2 IMPLANT
SUT VIC AB 2-0 CT1 18 (SUTURE) ×2 IMPLANT
SUT VIC AB 3-0 SH 8-18 (SUTURE) ×2 IMPLANT
TOWEL GREEN STERILE (TOWEL DISPOSABLE) ×2 IMPLANT
TOWEL GREEN STERILE FF (TOWEL DISPOSABLE) ×1 IMPLANT
TRAY FOLEY MTR SLVR 16FR STAT (SET/KITS/TRAYS/PACK) ×1 IMPLANT

## 2024-07-07 NOTE — Transfer of Care (Signed)
 Immediate Anesthesia Transfer of Care Note  Patient: Francisco Fleming  Procedure(s) Performed: POSTERIOR LUMBAR INTERBODY FUSION LUMBAR THREE-LUMBAR FOUR POSTERIOR LATERAL INTERBODY FUSION (Back)  Patient Location: PACU  Anesthesia Type:General  Level of Consciousness: awake, alert , and oriented  Airway & Oxygen Therapy: Patient Spontanous Breathing and Patient connected to face mask oxygen  Post-op Assessment: Report given to RN  Post vital signs: Reviewed and stable  Last Vitals:  Vitals Value Taken Time  BP 135/68 07/07/24 10:47  Temp    Pulse 64 07/07/24 10:50  Resp 20 07/07/24 10:50  SpO2 100 % 07/07/24 10:50  Vitals shown include unfiled device data.  Last Pain:  Vitals:   07/07/24 0653  TempSrc:   PainSc: 5       Patients Stated Pain Goal: 3 (07/07/24 9346)  Complications: No notable events documented.

## 2024-07-07 NOTE — Op Note (Signed)
 Date of procedure: 07/07/2024  Date of dictation: Same  Service: Neurosurgery  Preoperative diagnosis: L3-4 degenerative retrolisthesis with severe stenosis and neurogenic claudication  Postoperative diagnosis: Same  Procedure Name: Bilateral L3-4 decompressive laminotomies and foraminotomies, more than would be required for simple interbody fusion alone.  L3-4 posterior lumbar interbody fusion utilizing interbody cages, locally harvested autograft, and morselized allograft  L3-4 posterior lateral arthrodesis utilizing nonsegmental pedicle screw fixation and local autograft  Surgeon:Kasiya Burck A.Esbeydi Manago, M.D.  Asst. Surgeon: Jennetta, NP  Assistant utilized for exposure, decompression, fusion, instrumentation and closure  Anesthesia: General  Indication: 73 year old male with severe back and bilateral lower extremity symptoms consistent with neurogenic claudication which is failed conservative management workup demonstrates evidence of marked disc degeneration with disc space collapse and degenerative retrolisthesis and associated stenosis at L3-4.  Patient has failed conservative management and presents now for L3-4 decompression and fusion.  Operative note: After induction of anesthesia, patient positioned prone on the Wilson frame and appropriate padded.  Lumbar region prepped and draped sterilely.  Incision made overlying L3-4.  Dissection performed bilaterally.  Retractor placed.  Fluoroscopy used.  Levels confirmed.  Decompressive laminotomies.  Foraminotomies then performed using Leksell rongeurs, Kerrison rongeurs and high-speed drill to remove the inferior two thirds of the lamina of L3 bilaterally the entire inferior facet and pars interarticularis was resected bilaterally.  Of the superior facet of L4 and the superior aspect the L4 lamina was resected bilaterally.  Ligament flavum elevated and resected.  Underlying thecal sac and exiting L3 and L4 nerve roots identified.  Foraminotomies  completed along the course the exiting nerve roots.  Epidural veins plexus was coagulated and cut.  Disc space was then incised.  Discectomy then performed bilaterally.  Disc base then prepared for interbody fusion.  With the distractor placed patient's right side displaced further prepared on the left side.  A 9 mm Medtronic expandable cage was then impacted in place and expanded.  Distractor removed patient's right side.  Disc base prepared on the right side.  Morselized autograft was packed into the interspace.  A second cage was then impacted into place and expanded.  Pedicles of L3 and L4 were identified using surface landmarks and intraoperative fluoroscopy.  Superficial bone around the pedicle was then removed using high-speed drill.  Pedicle was then probed using a pedicle awl.  ProTrac was then probed and found to be solidly within the bone.  Each pedicle tract was then tapped with a screw tap.  Screw drilled hole was probed and found to be solidly within the bone.  6.5 mm Solera screws from Medtronic were placed bilaterally at L3 and L4.  Final images revealed good position cages and the hardware at the proper level with normal alignment of the spine.  Each cage was further filled with demineralized bone fibers.  Transverse processes of L3 and L4 were decorticated.  Morselized autograft was packed posterolaterally.  Short segment titanium rod placed to the screw heads at L3 and L4.  Locking caps placed to the screws.  Locking caps then engaged with the construct under mild compression.  Gelfoam was placed over the laminotomy defects.  Vancomycin powder was placed in the deep wound space.  Wounds then closed in layers with Vicryl sutures.  No apparent complications.  Patient tolerated the procedure well and he returns to recovery room postop.

## 2024-07-07 NOTE — Anesthesia Preprocedure Evaluation (Signed)
 Anesthesia Evaluation  Patient identified by MRN, date of birth, ID band Patient awake    Reviewed: Allergy & Precautions, NPO status , Patient's Chart, lab work & pertinent test results  History of Anesthesia Complications Negative for: history of anesthetic complications  Airway Mallampati: I  TM Distance: >3 FB Neck ROM: Full    Dental  (+) Teeth Intact, Dental Advisory Given   Pulmonary neg shortness of breath, neg sleep apnea, neg COPD, neg recent URI, Not current smoker   breath sounds clear to auscultation       Cardiovascular hypertension, Pt. on medications (-) angina (-) Past MI and (-) CHF  Rhythm:Regular     Neuro/Psych negative neurological ROS  negative psych ROS   GI/Hepatic negative GI ROS, Neg liver ROS,,,  Endo/Other  diabetes    Renal/GU negative Renal ROS     Musculoskeletal   Abdominal   Peds  Hematology Lab Results      Component                Value               Date                      WBC                      7.3                 06/27/2024                HGB                      15.6                06/27/2024                HCT                      47.7                06/27/2024                MCV                      99.4                06/27/2024                PLT                      207                 06/27/2024              Anesthesia Other Findings   Reproductive/Obstetrics                              Anesthesia Physical Anesthesia Plan  ASA: 2  Anesthesia Plan: General   Post-op Pain Management: Ofirmev  IV (intra-op)* and Toradol IV (intra-op)*   Induction: Intravenous  PONV Risk Score and Plan: 2 and Ondansetron  and Dexamethasone   Airway Management Planned: Oral ETT  Additional Equipment: None  Intra-op Plan:   Post-operative Plan: Extubation in OR  Informed Consent: I have reviewed the patients History and Physical, chart, labs and  discussed the procedure including the  risks, benefits and alternatives for the proposed anesthesia with the patient or authorized representative who has indicated his/her understanding and acceptance.     Dental advisory given  Plan Discussed with: CRNA  Anesthesia Plan Comments:         Anesthesia Quick Evaluation

## 2024-07-07 NOTE — Brief Op Note (Signed)
 07/07/2024  10:20 AM  PATIENT:  Francisco Fleming  73 y.o. male  PRE-OPERATIVE DIAGNOSIS:  Lumbar stenosis with neurogenic claudication  POST-OPERATIVE DIAGNOSIS:  Lumbar stenosis with neurogenic claudication  PROCEDURE:  Procedure(s): POSTERIOR LUMBAR INTERBODY FUSION LUMBAR THREE-LUMBAR FOUR POSTERIOR LATERAL INTERBODY FUSION (N/A)  SURGEON:  Surgeons and Role:    DEWAINE Louis Shove, MD - Primary  PHYSICIAN ASSISTANT:   ASSISTANTSBETHA Jennetta PIETY   ANESTHESIA:   general  EBL:  50 mL   BLOOD ADMINISTERED:none  DRAINS: none   LOCAL MEDICATIONS USED:  MARCAINE      SPECIMEN:  No Specimen  DISPOSITION OF SPECIMEN:  N/A  COUNTS:  YES  TOURNIQUET:  * No tourniquets in log *  DICTATION: .Dragon Dictation  PLAN OF CARE: Admit for overnight observation  PATIENT DISPOSITION:  PACU - hemodynamically stable.   Delay start of Pharmacological VTE agent (>24hrs) due to surgical blood loss or risk of bleeding: yes

## 2024-07-07 NOTE — Anesthesia Procedure Notes (Signed)
 Procedure Name: Intubation Date/Time: 07/07/2024 8:33 AM  Performed by: Mollie Olivia SAUNDERS, CRNAPre-anesthesia Checklist: Patient identified, Emergency Drugs available, Suction available and Patient being monitored Patient Re-evaluated:Patient Re-evaluated prior to induction Oxygen Delivery Method: Circle system utilized Preoxygenation: Pre-oxygenation with 100% oxygen Induction Type: IV induction Ventilation: Mask ventilation without difficulty Laryngoscope Size: Mac and 4 Grade View: Grade I Tube type: Oral Tube size: 7.5 mm Number of attempts: 1 Airway Equipment and Method: Stylet Placement Confirmation: ETT inserted through vocal cords under direct vision, positive ETCO2 and breath sounds checked- equal and bilateral Secured at: 23 cm Tube secured with: Tape Dental Injury: Teeth and Oropharynx as per pre-operative assessment

## 2024-07-07 NOTE — Progress Notes (Signed)
 Orthopedic Tech Progress Note Patient Details:  Francisco Fleming Feb 14, 1951 [id number]  Patient has back brace per PACU RN   Patient ID: SAFWAN TOMEI, male   DOB: [birth year], 73 y.o.   MRN: [id number]  Delanna LITTIE Pac 07/07/2024, 11:08 AM

## 2024-07-07 NOTE — H&P (Signed)
 Francisco Fleming is an 73 y.o. male.   Chief Complaint: Back pain HPI: 73 year old male with severe mechanical back pain with radiating pain numbness and weakness to both lower extremities consistent with severe neurogenic claudication which is failed conservative management.  Workup demonstrates evidence of degenerative disease at L3-4 with disc space collapse and degenerative retrolisthesis with a central disc protrusion causing severe stenosis at L3-4.  Patient presents now for L3-4 decompression and fusion in hopes of improving his symptoms.  Past Medical History:  Diagnosis Date   History of adenomatous polyp of colon    History of COVID-19 06/18/2021   08-16-2021 pt stated had mild symptoms that resolved but then had sinus infection and the flu with residual dry cough   History of eye injury    per pt right eye trauma from hit with baseball which caused increased eye pressure , uses eye drop   History of kidney stones 2021   Hyperplasia of prostate with lower urinary tract symptoms (LUTS)    Hypertension    Incomplete right bundle branch block (RBBB)    Prostate cancer (HCC) 07/2020   urologist--- dr gay;  dx 11/ 2021 Gleason 3+3 active survillence;  biopsy 09/ 2022 Gleason 3+4   Type 2 diabetes mellitus (HCC)    followed  by pcp  (08-16-2021 pt stated check blood sugar once weekly fasting but did not know average)   Wears glasses     Past Surgical History:  Procedure Laterality Date   CATARACT EXTRACTION W/ INTRAOCULAR LENS IMPLANT Right 2016   COLONOSCOPY  07/2019   by dr abran   CYSTOSCOPY  08/22/2021   Procedure: CYSTOSCOPY;  Surgeon: Selma Donnice SAUNDERS, MD;  Location: [facility];  Service: Urology;;   LUMBAR LAMINECTOMY     1970s;    L3--L4   NASAL SINUS SURGERY     2002 approx   RADIOACTIVE SEED IMPLANT N/A 08/22/2021   Procedure: RADIOACTIVE SEED IMPLANT/BRACHYTHERAPY IMPLANT;  Surgeon: Selma Donnice SAUNDERS, MD;  Location: [facility];  Service:  Urology;  Laterality: N/A;  66 seeds implanted   SHOULDER ARTHROSCOPY WITH ROTATOR CUFF REPAIR AND OPEN BICEPS TENODESIS Left 09/2018   SPACE OAR INSTILLATION N/A 08/22/2021   Procedure: SPACE OAR INSTILLATION;  Surgeon: Selma Donnice SAUNDERS, MD;  Location: [facility];  Service: Urology;  Laterality: N/A;   TONSILLECTOMY     child    Family History  Problem Relation Age of Onset   Colon polyps Mother    Cervical cancer Mother    Lung cancer Brother        non smoker. 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   Esophageal cancer Neg Hx    Rectal cancer Neg Hx    Stomach cancer Neg Hx    Breast cancer Neg Hx    Pancreatic cancer Neg Hx    Prostate cancer Neg Hx    Colon cancer Neg Hx    Social History:  reports that he has been smoking cigars. He quit smokeless tobacco use about 36 years ago.  His smokeless tobacco use included chew. He reports that he does not drink alcohol  and does not use drugs.  Allergies:  Allergies  Allergen Reactions   Penicillins Hives and Rash    Has patient had a PCN reaction causing immediate rash, facial/tongue/throat swelling, SOB or lightheadedness with hypotension:No Has patient had a PCN reaction causing severe rash involving mucus membranes or skin necrosis:No Has patient had a PCN reaction  that required hospitalization:No Has patient had a PCN reaction occurring within the last 10 years:No If all of the above answers are NO, then may proceed with Cephalosporin use.     Medications Prior to Admission  Medication Sig Dispense Refill   acetaminophen  (TYLENOL ) 500 MG tablet Take 500 mg by mouth every 6 (six) hours as needed for moderate pain (pain score 4-6).     amLODipine (NORVASC) 2.5 MG tablet Take 2.5 mg by mouth daily.     latanoprost (XALATAN) 0.005 % ophthalmic solution Place 1 drop into both eyes at bedtime.     metFORMIN (GLUCOPHAGE) 1000 MG tablet Take 1,000 mg by mouth 2 (two) times daily.  11   pioglitazone (ACTOS) 45 MG  tablet Take 22.5 mg by mouth daily.  11   rosuvastatin (CRESTOR) 20 MG tablet Take 20 mg by mouth daily.     telmisartan (MICARDIS) 80 MG tablet Take 80 mg by mouth daily.     TRESIBA FLEXTOUCH 100 UNIT/ML SOPN FlexTouch Pen Inject 22 Units into the skin every evening.     BD PEN NEEDLE NANO U/F 32G X 4 MM MISC USE ONE PEN NEEDLE PER TRESIBA INJECTION DAILY     Beclomethasone Dipropionate  (QNASL ) 80 MCG/ACT AERS Place 2 sprays into the nose daily. (Patient not taking: Reported on 06/25/2024) 10.6 g 0   cefdinir  (OMNICEF ) 300 MG capsule Take 1 capsule (300 mg total) by mouth 2 (two) times daily. (Patient not taking: Reported on 06/25/2024) 14 capsule 0   mometasone  (NASONEX ) 50 MCG/ACT nasal spray Place 2 sprays into the nose daily. (Patient not taking: Reported on 06/25/2024) 1 each 0   montelukast  (SINGULAIR ) 10 MG tablet Take 1 tablet (10 mg total) by mouth daily. (Patient not taking: Reported on 06/25/2024) 30 tablet 2    Results for orders placed or performed during the hospital encounter of 07/07/24 (from the past 48 hours)  Glucose, capillary     Status: Abnormal   Collection Time: 07/07/24  6:19 AM  Result Value Ref Range   Glucose-Capillary 174 (H) 70 - 99 mg/dL    Comment: Glucose reference range applies only to samples taken after fasting for at least 8 hours.  ABO/Rh     Status: None (Preliminary result)   Collection Time: 07/07/24  7:00 AM  Result Value Ref Range   ABO/RH(D) PENDING    No results found.  Pertinent items noted in HPI and remainder of comprehensive ROS otherwise negative.  Blood pressure (!) 147/60, pulse 61, temperature 97.8 F (36.6 C), temperature source Oral, resp. rate 20, height 5' 9 (1.753 m), weight 74.8 kg, SpO2 98%.  Patient is awake and alert.  He is oriented and appropriate.  Speech is fluent.  Judgment insight are intact.  Cranial nerve function normal bilateral.  Motor examination reveals normal motor strength bilateral.  Sensory examination of some  patchy distal sensory loss in both lower extremities.  Reflexes are hypoactive but symmetric.  No so long track signs.  Gait antalgic.  Posture mildly flexed.  Examination head ears eyes nose and throat is unremarkable her chest and abdomen are benign.  Extremities are free from injury deformity. Assessment/Plan L3-4 degenerative retrolisthesis with stenosis and neurogenic claudication.  Plan bilateral L3-4 decompressive laminotomies and foraminotomies followed by posterior lumbar interbody fusion utilizing interbody cages, local posted autograft and augmented with posterolateral thesis utilizing nonsegmental pedicle screw fixation and local autografting.  Risks and benefits been explained.  Patient wishes to proceed.  [facility] [employer] [place] 07/07/2024,  7:58 AM

## 2024-07-08 DIAGNOSIS — M48062 Spinal stenosis, lumbar region with neurogenic claudication: Secondary | ICD-10-CM | POA: Diagnosis not present

## 2024-07-08 LAB — GLUCOSE, CAPILLARY: Glucose-Capillary: 162 mg/dL — ABNORMAL HIGH (ref 70–99)

## 2024-07-08 MED ORDER — METHOCARBAMOL 500 MG PO TABS: 500.0000 mg | ORAL_TABLET | Freq: Four times a day (QID) | ORAL | 1 refills | Status: AC | PRN

## 2024-07-08 MED ORDER — HYDROCODONE-ACETAMINOPHEN 10-325 MG PO TABS: 1.0000 | ORAL_TABLET | ORAL | 0 refills | Status: AC | PRN

## 2024-07-08 NOTE — Discharge Instructions (Signed)
 Wound Care Keep incision covered and dry until post op day 3. You may remove the Honeycomb dressing on post op day 3. Leave steri-strips on back.  They will fall off by themselves. Do not put any creams, lotions, or ointments on incision. You are fine to shower. Let water run over incision and pat dry.   Activity Walk each and every day, increasing distance each day. No lifting greater than 8 lbs.  No lifting no bending no twisting no driving . 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 If provided with back brace, wear when out of bed.  It is not necessary to wear brace in bed.  Diet Resume your normal diet.  Call Your Doctor If Any of These Occur Redness, drainage, or swelling at the wound.  Temperature greater than 101 degrees. Severe pain not relieved by pain medication. Incision starts to come apart.  Follow Up Appt Call [phone] if you have one or any problem.

## 2024-07-08 NOTE — Evaluation (Signed)
 Physical Therapy Evaluation Patient Details Name: Francisco Fleming MRN: [id number] DOB: 1951-07-20 Today's Date: 07/08/2024  History of Present Illness  Pt is a 73 y/o male presenting on 10/20 for same day bil L3-4 decompressive laminotomies and foraminotomies followed by PLIF. PMH includes: HTN, prostate cancer, DM2, prior back surgery.  Clinical Impression  Pt presents to PT mobilizing well s/p spinal fusion. Pt is able to ambulate for household distances with support of RW and negotiates a flight of steps with support of railing. Pt dons/doffs brace independently and demonstrates fair knowledge of back precautions. Pt is encouraged to mobilize frequently in an effort to improve endurance and to restore the pt's prior level of function. No further acute PT services are indicated, PT signing off.        If plan is discharge home, recommend the following:     Can travel by private vehicle        Equipment Recommendations None recommended by PT  Recommendations for Other Services       Functional Status Assessment Patient has had a recent decline in their functional status and demonstrates the ability to make significant improvements in function in a reasonable and predictable amount of time.     Precautions / Restrictions Precautions Precautions: Fall;Back Precaution Booklet Issued: Yes (comment) Recall of Precautions/Restrictions: Intact Required Braces or Orthoses: Spinal Brace Spinal Brace: Lumbar corset;Applied in sitting position Restrictions Weight Bearing Restrictions Per Provider Order: No      Mobility  Bed Mobility Overal bed mobility: Modified Independent             General bed mobility comments: log roll technique utilized with min cues from spouse    Transfers Overall transfer level: Modified independent Equipment used: Rolling walker (2 wheels)                    Ambulation/Gait Ambulation/Gait assistance: Modified independent  (Device/Increase time) Gait Distance (Feet): 300 Feet Assistive device: Rolling walker (2 wheels) Gait Pattern/deviations: Step-through pattern Gait velocity: reduced Gait velocity interpretation: 1.31 - 2.62 ft/sec, indicative of limited community ambulator   General Gait Details: steady step-through gait  Stairs Stairs: Yes Stairs assistance: Modified independent (Device/Increase time) Stair Management: One rail Right, Alternating pattern Number of Stairs: 10    Wheelchair Mobility     Tilt Bed    Modified Rankin (Stroke Patients Only)       Balance Overall balance assessment: Needs assistance Sitting-balance support: Feet supported, No upper extremity supported Sitting balance-Leahy Scale: Good     Standing balance support: No upper extremity supported, During functional activity Standing balance-Leahy Scale: Good                               Pertinent Vitals/Pain Pain Assessment Pain Assessment: Faces Faces Pain Scale: Hurts a little bit Pain Location: back Pain Descriptors / Indicators: Sore Pain Intervention(s): Monitored during session    Home Living Family/patient expects to be discharged to:: Private residence Living Arrangements: Spouse/significant other Available Help at Discharge: Family Type of Home: House Home Access: Stairs to enter   Secretary/administrator of Steps: 1 Alternate Level Stairs-Number of Steps: 7 Home Layout: Multi-level;Bed/bath upstairs Home Equipment: Agricultural consultant (2 wheels);Grab bars - tub/shower Additional Comments: can borrow shower chair from neighbor if needed    Prior Function Prior Level of Function : Independent/Modified Independent;Working/employed;Driving               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     Extremity/Trunk Assessment   Upper Extremity Assessment Upper Extremity Assessment: Overall WFL for tasks assessed    Lower Extremity Assessment Lower Extremity Assessment:  Overall WFL for tasks assessed    Cervical / Trunk Assessment Cervical / Trunk Assessment: Back Surgery  Communication   Communication Communication: No apparent difficulties    Cognition Arousal: Alert Behavior During Therapy: WFL for tasks assessed/performed   PT - Cognitive impairments: No apparent impairments                         Following commands: Intact       Cueing Cueing Techniques: Verbal cues     General Comments General comments (skin integrity, edema, etc.): VSS on RA    Exercises     Assessment/Plan    PT Assessment Patient does not need any further PT services  PT Problem List         PT Treatment Interventions      PT Goals (Current goals can be found in the Care Plan section)       Frequency       Co-evaluation               AM-PAC PT 6 Clicks Mobility  Outcome Measure Help needed turning from your back to your side while in a flat bed without using bedrails?: None Help needed moving from lying on your back to sitting on the side of a flat bed without using bedrails?: None Help needed moving to and from a bed to a chair (including a wheelchair)?: None Help needed standing up from a chair using your arms (e.g., wheelchair or bedside chair)?: None Help needed to walk in hospital room?: None Help needed climbing 3-5 steps with a railing? : None 6 Click Score: 24    End of Session Equipment Utilized During Treatment: Gait belt;Back brace Activity Tolerance: Patient tolerated treatment well Patient left: in bed;with call bell/phone within reach Nurse Communication: Mobility status PT Visit Diagnosis: Other abnormalities of gait and mobility (R26.89)    Time: 9088-9074 PT Time Calculation (min) (ACUTE ONLY): 14 min   Charges:   PT Evaluation $PT Eval Low Complexity: 1 Low   PT General Charges $$ ACUTE PT VISIT: 1 Visit         Bernardino JINNY Ruth, PT, DPT Acute Rehabilitation Office [phone]   Bernardino JINNY Ruth 07/08/2024, 11:35 AM

## 2024-07-08 NOTE — Evaluation (Signed)
 Occupational Therapy Evaluation Patient Details Name: Francisco Fleming MRN: [id number] DOB: 20-Apr-1951 Today's Date: 07/08/2024   History of Present Illness   Pt is a 73 y/o male presenting on 10/20 for same day bil L3-4 decompressive laminotomies and foraminotomies followed by PLIF. PMH includes: HTN, prostate cancer, DM2, prior back surgery.     Clinical Impressions Patient admitted for above and presents with problem list below.  PTA pt was independent and working. Patient was educated on brace mgmt and wear schedule, back precautions, ADL compensatory techniques, AE/DME, mobility progression, safety and recommendations.  Today, pt demonstrated ability to complete bed mobility with modified independence, transfers using RW with modified independence, functional mobility using RW with modified independence, and ADLs with up to modified independence using figure 4 technique for LB.  At discharge, pt will have support from spouse as needed.  Based on performance today, no further OT needs identified.  OT will sign off.       If plan is discharge home, recommend the following:   Assistance with cooking/housework;Assist for transportation;Help with stairs or ramp for entrance     Functional Status Assessment         Equipment Recommendations   None recommended by OT (pt plans to borrow shower chair from neighbor)     Recommendations for Other Services         Precautions/Restrictions   Precautions Precautions: Fall;Back Precaution Booklet Issued: Yes (comment) Recall of Precautions/Restrictions: Intact Required Braces or Orthoses: Spinal Brace Spinal Brace: Lumbar corset;Applied in sitting position Restrictions Weight Bearing Restrictions Per Provider Order: No     Mobility Bed Mobility Overal bed mobility: Modified Independent             General bed mobility comments: log roll technique without assist    Transfers Overall transfer level: Modified  independent Equipment used: Rolling walker (2 wheels)               General transfer comment: cueing for posture but improved throughout session      Balance Overall balance assessment: Mild deficits observed, not formally tested (preference for RW dynamically)                                         ADL either performed or assessed with clinical judgement   ADL Overall ADL's : Modified independent                                       General ADL Comments: pt able to follow back precautions and manage ADLs.  Used figure 4 technique to manage LB ADLs, discussed safety with dressing sitting.  Donned brace after educated on positioning.     Vision Baseline Vision/History: 1 Wears glasses Vision Assessment?: No apparent visual deficits     Perception         Praxis         Pertinent Vitals/Pain Pain Assessment Pain Assessment: Faces Faces Pain Scale: Hurts little more Pain Location: back, incisional Pain Descriptors / Indicators: Discomfort, Operative site guarding Pain Intervention(s): Limited activity within patient's tolerance, Monitored during session, Repositioned     Extremity/Trunk Assessment Upper Extremity Assessment Upper Extremity Assessment: Overall WFL for tasks assessed   Lower Extremity Assessment Lower Extremity Assessment: Defer to PT evaluation   Cervical / Trunk Assessment Cervical /  Trunk Assessment: Back Surgery   Communication Communication Communication: No apparent difficulties   Cognition Arousal: Alert Behavior During Therapy: WFL for tasks assessed/performed Cognition: No apparent impairments                               Following commands: Intact       Cueing  General Comments   Cueing Techniques: Verbal cues  spouse at side and supportive   Exercises     Shoulder Instructions      Home Living Family/patient expects to be discharged to:: Private residence Living  Arrangements: Spouse/significant other Available Help at Discharge: Family Type of Home: House Home Access: Stairs to enter Secretary/administrator of Steps: 1   Home Layout: Multi-level;Bed/bath upstairs Alternate Level Stairs-Number of Steps: 7 Alternate Level Stairs-Rails:  (+ rail) Bathroom Shower/Tub: Producer, television/film/video: Handicapped height     Home Equipment: Agricultural consultant (2 wheels);Grab bars - tub/shower   Additional Comments: can borrow shower chair from neighbor if needed      Prior Functioning/Environment Prior Level of Function : Independent/Modified Independent;Working/employed;Driving               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    OT Problem List: Decreased activity tolerance;Impaired balance (sitting and/or standing);Pain;Decreased knowledge of precautions;Decreased knowledge of use of DME or AE   OT Treatment/Interventions:        OT Goals(Current goals can be found in the care plan section)   Acute Rehab OT Goals Patient Stated Goal: home OT Goal Formulation: With patient   OT Frequency:       Co-evaluation              AM-PAC OT 6 Clicks Daily Activity     Outcome Measure Help from another person eating meals?: None Help from another person taking care of personal grooming?: None Help from another person toileting, which includes using toliet, bedpan, or urinal?: None Help from another person bathing (including washing, rinsing, drying)?: None Help from another person to put on and taking off regular upper body clothing?: None Help from another person to put on and taking off regular lower body clothing?: None 6 Click Score: 24   End of Session Equipment Utilized During Treatment: Rolling walker (2 wheels);Back brace Nurse Communication: Mobility status;Precautions  Activity Tolerance: Patient tolerated treatment well Patient left: with call bell/phone within reach;Other (comment);with family/visitor present  (sitting EOB)  OT Visit Diagnosis: Other abnormalities of gait and mobility (R26.89);Muscle weakness (generalized) (M62.81);Pain Pain - part of body:  (back)                Time: 9181-9159 OT Time Calculation (min): 22 min Charges:  OT General Charges $OT Visit: 1 Visit OT Evaluation $OT Eval Low Complexity: 1 Low  Francisco Fleming, OT Acute Rehabilitation Services Office [phone] Secure Chat Preferred    Francisco Fleming 07/08/2024, 9:28 AM

## 2024-07-08 NOTE — Discharge Summary (Signed)
 Physician Discharge Summary  Patient ID: Francisco Fleming MRN: [id number] DOB/AGE: Jan 10, 1951 73 y.o.  Admit date: 07/07/2024 Discharge date: 07/08/2024  Admission Diagnoses:  Discharge Diagnoses:  Principal Problem:   Degenerative spondylolisthesis   Discharged Condition: good  Hospital Course: Patient admitted to the hospital where he underwent uncomplicated lumbar radicular  Consults:   Significant Diagnostic Studies:   Treatments:   Discharge Exam: Blood pressure (!) 144/51, pulse 68, temperature 98 F (36.7 C), temperature source Oral, resp. rate 18, height 5' 9 (1.753 m), weight 74.8 kg, SpO2 98%. Awake and alert.  Oriented and appropriate.  Motor and sensory function intact.  Wound clean and dry.  Chest and abdomen benign.  Disposition: Discharge disposition: 01-Home or Self Care        Allergies as of 07/08/2024       Reactions   Penicillins Hives, Rash   Has patient had a PCN reaction causing immediate rash, facial/tongue/throat swelling, SOB or lightheadedness with hypotension:No Has patient had a PCN reaction causing severe rash involving mucus membranes or skin necrosis:No Has patient had a PCN reaction that required hospitalization:No Has patient had a PCN reaction occurring within the last 10 years:No If all of the above answers are NO, then may proceed with Cephalosporin use.        Medication List     TAKE these medications    acetaminophen  500 MG tablet Commonly known as: TYLENOL  Take 500 mg by mouth every 6 (six) hours as needed for moderate pain (pain score 4-6).   amLODipine 2.5 MG tablet Commonly known as: NORVASC Take 2.5 mg by mouth daily.   BD Pen Needle Nano U/F 32G X 4 MM Misc Generic drug: Insulin Pen Needle USE ONE PEN NEEDLE PER TRESIBA INJECTION DAILY   cefdinir  300 MG capsule Commonly known as: OMNICEF  Take 1 capsule (300 mg total) by mouth 2 (two) times daily.   HYDROcodone -acetaminophen  10-325 MG tablet Commonly  known as: NORCO Take 1 tablet by mouth every 4 (four) hours as needed for moderate pain (pain score 4-6) ((score 4 to 6)).   latanoprost 0.005 % ophthalmic solution Commonly known as: XALATAN Place 1 drop into both eyes at bedtime.   metFORMIN 1000 MG tablet Commonly known as: GLUCOPHAGE Take 1,000 mg by mouth 2 (two) times daily.   methocarbamol 500 MG tablet Commonly known as: ROBAXIN Take 1 tablet (500 mg total) by mouth every 6 (six) hours as needed for muscle spasms.   mometasone  50 MCG/ACT nasal spray Commonly known as: Nasonex  Place 2 sprays into the nose daily.   montelukast  10 MG tablet Commonly known as: SINGULAIR  Take 1 tablet (10 mg total) by mouth daily.   pioglitazone 45 MG tablet Commonly known as: ACTOS Take 22.5 mg by mouth daily.   Qnasl  80 MCG/ACT Aers Generic drug: Beclomethasone Dipropionate  Place 2 sprays into the nose daily.   rosuvastatin 20 MG tablet Commonly known as: CRESTOR Take 20 mg by mouth daily.   telmisartan 80 MG tablet Commonly known as: MICARDIS Take 80 mg by mouth daily.   Tresiba FlexTouch 100 UNIT/ML FlexTouch Pen Generic drug: insulin degludec Inject 22 Units into the skin every evening.               Durable Medical Equipment  (From admission, onward)           Start     Ordered   07/07/24 1144  DME Walker rolling  Once       Question:  Patient needs  a walker to treat with the following condition  Answer:  Degenerative spondylolisthesis   07/07/24 1143   07/07/24 1144  DME 3 n 1  Once        07/07/24 1143            Follow-up Information     Louis Shove, MD Follow up.   Specialty: Neurosurgery Why: As needed, If symptoms worsen Contact information: [address] [place] [address] [phone]                 Signed: Shove DELENA Louis 07/08/2024, 11:42 AM

## 2024-07-08 NOTE — Anesthesia Postprocedure Evaluation (Signed)
 Anesthesia Post Note  Patient: Francisco Fleming  Procedure(s) Performed: POSTERIOR LUMBAR INTERBODY FUSION LUMBAR THREE-LUMBAR FOUR POSTERIOR LATERAL INTERBODY FUSION (Back)     Patient location during evaluation: PACU Anesthesia Type: General Level of consciousness: awake and alert Pain management: pain level controlled Vital Signs Assessment: post-procedure vital signs reviewed and stable Respiratory status: spontaneous breathing, nonlabored ventilation and respiratory function stable Cardiovascular status: blood pressure returned to baseline and stable Postop Assessment: no apparent nausea or vomiting Anesthetic complications: no   No notable events documented.                Isidore Margraf

## 2024-07-08 NOTE — Care Management Obs Status (Signed)
 MEDICARE OBSERVATION STATUS NOTIFICATION   Patient Details  Name: Francisco Fleming MRN: [id number] Date of Birth: 1951-08-03   Medicare Observation Status Notification Given:  Yes    Jon Cruel 07/08/2024, 9:59 AM

## 2024-07-08 NOTE — Progress Notes (Signed)

## 2024-07-09 MED FILL — Heparin Sodium (Porcine) Inj 1000 Unit/ML: INTRAMUSCULAR | Qty: 30 | Status: AC

## 2024-07-09 MED FILL — Sodium Chloride IV Soln 0.9%: INTRAVENOUS | Qty: 2000 | Status: AC

## 2024-08-19 DIAGNOSIS — N401 Enlarged prostate with lower urinary tract symptoms: Secondary | ICD-10-CM | POA: Diagnosis not present

## 2024-08-19 DIAGNOSIS — N5201 Erectile dysfunction due to arterial insufficiency: Secondary | ICD-10-CM | POA: Diagnosis not present

## 2024-08-19 DIAGNOSIS — R351 Nocturia: Secondary | ICD-10-CM | POA: Diagnosis not present

## 2024-08-19 DIAGNOSIS — R3912 Poor urinary stream: Secondary | ICD-10-CM | POA: Diagnosis not present

## 2024-08-19 DIAGNOSIS — C61 Malignant neoplasm of prostate: Secondary | ICD-10-CM | POA: Diagnosis not present

## 2024-08-27 DIAGNOSIS — M48062 Spinal stenosis, lumbar region with neurogenic claudication: Secondary | ICD-10-CM | POA: Diagnosis not present

## 2024-09-05 NOTE — Progress Notes (Signed)
 Francisco Fleming                                          MRN: [id number]   09/05/2024   The [facility] Quality Team Specialist reviewed this patient medical record for the purposes of chart review for care gap closure. The following were reviewed: chart review for care gap closure-kidney health evaluation for diabetes:eGFR  and uACR.    [facility] Quality Team
